# Patient Record
Sex: Female | Born: 1950 | Race: Black or African American | Hispanic: No | Marital: Single | Smoking: Former smoker
Health system: Southern US, Community
[De-identification: ages and names within clinical notes are randomized; demographics above are authoritative.]

## PROBLEM LIST (undated history)

## (undated) DIAGNOSIS — I10 Essential (primary) hypertension: Secondary | ICD-10-CM

## (undated) DIAGNOSIS — I639 Cerebral infarction, unspecified: Secondary | ICD-10-CM

## (undated) DIAGNOSIS — E079 Disorder of thyroid, unspecified: Secondary | ICD-10-CM

## (undated) DIAGNOSIS — C73 Malignant neoplasm of thyroid gland: Secondary | ICD-10-CM

## (undated) DIAGNOSIS — E785 Hyperlipidemia, unspecified: Secondary | ICD-10-CM

## (undated) DIAGNOSIS — M1990 Unspecified osteoarthritis, unspecified site: Secondary | ICD-10-CM

## (undated) HISTORY — DX: Hyperlipidemia, unspecified: E78.5

## (undated) HISTORY — DX: Essential (primary) hypertension: I10

## (undated) HISTORY — PX: TOTAL ABDOMINAL HYSTERECTOMY: SHX209

## (undated) HISTORY — DX: Disorder of thyroid, unspecified: E07.9

## (undated) HISTORY — PX: CARPAL TUNNEL RELEASE: SHX101

## (undated) HISTORY — PX: THYROIDECTOMY: SHX17

## (undated) HISTORY — DX: Cerebral infarction, unspecified: I63.9

---

## 1997-09-28 ENCOUNTER — Emergency Department (HOSPITAL_COMMUNITY): Admission: EM | Admit: 1997-09-28 | Discharge: 1997-09-28 | Payer: Self-pay | Admitting: Emergency Medicine

## 1998-03-06 ENCOUNTER — Observation Stay (HOSPITAL_COMMUNITY): Admission: EM | Admit: 1998-03-06 | Discharge: 1998-03-07 | Payer: Self-pay | Admitting: Emergency Medicine

## 1998-04-14 ENCOUNTER — Encounter: Payer: Self-pay | Admitting: *Deleted

## 1998-04-16 ENCOUNTER — Inpatient Hospital Stay (HOSPITAL_COMMUNITY): Admission: RE | Admit: 1998-04-16 | Discharge: 1998-04-17 | Payer: Self-pay | Admitting: *Deleted

## 1998-07-08 ENCOUNTER — Emergency Department (HOSPITAL_COMMUNITY): Admission: EM | Admit: 1998-07-08 | Discharge: 1998-07-08 | Payer: Self-pay | Admitting: Emergency Medicine

## 1998-08-22 ENCOUNTER — Emergency Department (HOSPITAL_COMMUNITY): Admission: EM | Admit: 1998-08-22 | Discharge: 1998-08-22 | Payer: Self-pay | Admitting: Emergency Medicine

## 1998-08-22 ENCOUNTER — Encounter: Payer: Self-pay | Admitting: Emergency Medicine

## 1999-01-26 ENCOUNTER — Encounter: Payer: Self-pay | Admitting: Emergency Medicine

## 1999-01-26 ENCOUNTER — Emergency Department (HOSPITAL_COMMUNITY): Admission: EM | Admit: 1999-01-26 | Discharge: 1999-01-26 | Payer: Self-pay | Admitting: Emergency Medicine

## 1999-02-22 ENCOUNTER — Encounter: Payer: Self-pay | Admitting: Emergency Medicine

## 1999-02-22 ENCOUNTER — Emergency Department (HOSPITAL_COMMUNITY): Admission: EM | Admit: 1999-02-22 | Discharge: 1999-02-22 | Payer: Self-pay

## 1999-06-30 ENCOUNTER — Emergency Department (HOSPITAL_COMMUNITY): Admission: EM | Admit: 1999-06-30 | Discharge: 1999-06-30 | Payer: Self-pay | Admitting: Emergency Medicine

## 1999-07-09 ENCOUNTER — Emergency Department (HOSPITAL_COMMUNITY): Admission: EM | Admit: 1999-07-09 | Discharge: 1999-07-09 | Payer: Self-pay | Admitting: Emergency Medicine

## 1999-08-23 ENCOUNTER — Ambulatory Visit (HOSPITAL_COMMUNITY): Admission: RE | Admit: 1999-08-23 | Discharge: 1999-08-23 | Payer: Self-pay | Admitting: Internal Medicine

## 1999-08-31 ENCOUNTER — Emergency Department (HOSPITAL_COMMUNITY): Admission: EM | Admit: 1999-08-31 | Discharge: 1999-08-31 | Payer: Self-pay | Admitting: Emergency Medicine

## 1999-09-07 ENCOUNTER — Encounter: Admission: RE | Admit: 1999-09-07 | Discharge: 1999-09-07 | Payer: Self-pay | Admitting: Internal Medicine

## 1999-10-12 ENCOUNTER — Other Ambulatory Visit: Admission: RE | Admit: 1999-10-12 | Discharge: 1999-10-12 | Payer: Self-pay | Admitting: Endocrinology

## 1999-11-21 ENCOUNTER — Emergency Department (HOSPITAL_COMMUNITY): Admission: EM | Admit: 1999-11-21 | Discharge: 1999-11-21 | Payer: Self-pay | Admitting: Emergency Medicine

## 2000-01-09 ENCOUNTER — Ambulatory Visit (HOSPITAL_COMMUNITY): Admission: RE | Admit: 2000-01-09 | Discharge: 2000-01-10 | Payer: Self-pay | Admitting: Internal Medicine

## 2000-03-24 ENCOUNTER — Emergency Department (HOSPITAL_COMMUNITY): Admission: EM | Admit: 2000-03-24 | Discharge: 2000-03-25 | Payer: Self-pay | Admitting: Emergency Medicine

## 2000-03-25 ENCOUNTER — Encounter: Payer: Self-pay | Admitting: Emergency Medicine

## 2001-09-29 ENCOUNTER — Emergency Department (HOSPITAL_COMMUNITY): Admission: EM | Admit: 2001-09-29 | Discharge: 2001-09-29 | Payer: Self-pay | Admitting: Emergency Medicine

## 2002-01-13 ENCOUNTER — Encounter: Payer: Self-pay | Admitting: Internal Medicine

## 2002-01-13 ENCOUNTER — Encounter: Admission: RE | Admit: 2002-01-13 | Discharge: 2002-01-13 | Payer: Self-pay | Admitting: Internal Medicine

## 2002-07-11 ENCOUNTER — Encounter: Payer: Self-pay | Admitting: Internal Medicine

## 2002-07-11 ENCOUNTER — Encounter: Admission: RE | Admit: 2002-07-11 | Discharge: 2002-07-11 | Payer: Self-pay | Admitting: Internal Medicine

## 2002-08-08 ENCOUNTER — Emergency Department (HOSPITAL_COMMUNITY): Admission: EM | Admit: 2002-08-08 | Discharge: 2002-08-08 | Payer: Self-pay | Admitting: Emergency Medicine

## 2003-07-25 ENCOUNTER — Emergency Department (HOSPITAL_COMMUNITY): Admission: EM | Admit: 2003-07-25 | Discharge: 2003-07-25 | Payer: Self-pay | Admitting: *Deleted

## 2004-07-13 ENCOUNTER — Encounter: Admission: RE | Admit: 2004-07-13 | Discharge: 2004-07-13 | Payer: Self-pay | Admitting: Internal Medicine

## 2005-06-02 ENCOUNTER — Inpatient Hospital Stay (HOSPITAL_COMMUNITY): Admission: EM | Admit: 2005-06-02 | Discharge: 2005-06-05 | Payer: Self-pay | Admitting: Emergency Medicine

## 2005-06-30 ENCOUNTER — Encounter (HOSPITAL_COMMUNITY): Admission: RE | Admit: 2005-06-30 | Discharge: 2005-09-28 | Payer: Self-pay | Admitting: Cardiology

## 2005-07-14 ENCOUNTER — Encounter: Admission: RE | Admit: 2005-07-14 | Discharge: 2005-07-14 | Payer: Self-pay | Admitting: Internal Medicine

## 2005-07-26 ENCOUNTER — Encounter: Payer: Self-pay | Admitting: Emergency Medicine

## 2007-03-28 DIAGNOSIS — I639 Cerebral infarction, unspecified: Secondary | ICD-10-CM

## 2007-03-28 HISTORY — DX: Cerebral infarction, unspecified: I63.9

## 2007-10-23 ENCOUNTER — Inpatient Hospital Stay (HOSPITAL_COMMUNITY): Admission: EM | Admit: 2007-10-23 | Discharge: 2007-10-30 | Payer: Self-pay | Admitting: Emergency Medicine

## 2007-10-24 ENCOUNTER — Encounter (INDEPENDENT_AMBULATORY_CARE_PROVIDER_SITE_OTHER): Payer: Self-pay | Admitting: Cardiology

## 2007-10-28 ENCOUNTER — Ambulatory Visit: Payer: Self-pay | Admitting: Physical Medicine & Rehabilitation

## 2007-10-30 ENCOUNTER — Inpatient Hospital Stay (HOSPITAL_COMMUNITY)
Admission: RE | Admit: 2007-10-30 | Discharge: 2007-11-02 | Payer: Self-pay | Admitting: Physical Medicine & Rehabilitation

## 2007-10-30 ENCOUNTER — Ambulatory Visit: Payer: Self-pay | Admitting: Physical Medicine & Rehabilitation

## 2007-11-05 ENCOUNTER — Encounter
Admission: RE | Admit: 2007-11-05 | Discharge: 2008-02-03 | Payer: Self-pay | Admitting: Physical Medicine & Rehabilitation

## 2007-12-03 ENCOUNTER — Encounter
Admission: RE | Admit: 2007-12-03 | Discharge: 2008-01-24 | Payer: Self-pay | Admitting: Physical Medicine & Rehabilitation

## 2007-12-04 ENCOUNTER — Ambulatory Visit: Payer: Self-pay | Admitting: Physical Medicine & Rehabilitation

## 2008-01-24 ENCOUNTER — Ambulatory Visit: Payer: Self-pay | Admitting: Physical Medicine & Rehabilitation

## 2008-02-04 ENCOUNTER — Encounter
Admission: RE | Admit: 2008-02-04 | Discharge: 2008-03-06 | Payer: Self-pay | Admitting: Physical Medicine & Rehabilitation

## 2008-04-23 ENCOUNTER — Encounter
Admission: RE | Admit: 2008-04-23 | Discharge: 2008-04-24 | Payer: Self-pay | Admitting: Physical Medicine & Rehabilitation

## 2008-04-24 ENCOUNTER — Ambulatory Visit: Payer: Self-pay | Admitting: Physical Medicine & Rehabilitation

## 2008-08-19 ENCOUNTER — Encounter
Admission: RE | Admit: 2008-08-19 | Discharge: 2008-08-21 | Payer: Self-pay | Admitting: Physical Medicine & Rehabilitation

## 2008-08-21 ENCOUNTER — Ambulatory Visit: Payer: Self-pay | Admitting: Physical Medicine & Rehabilitation

## 2008-11-09 ENCOUNTER — Encounter
Admission: RE | Admit: 2008-11-09 | Discharge: 2008-11-13 | Payer: Self-pay | Admitting: Physical Medicine & Rehabilitation

## 2008-11-13 ENCOUNTER — Ambulatory Visit: Payer: Self-pay | Admitting: Physical Medicine & Rehabilitation

## 2009-05-11 ENCOUNTER — Encounter
Admission: RE | Admit: 2009-05-11 | Discharge: 2009-05-14 | Payer: Self-pay | Admitting: Physical Medicine & Rehabilitation

## 2009-05-12 ENCOUNTER — Encounter: Admission: RE | Admit: 2009-05-12 | Discharge: 2009-05-12 | Payer: Self-pay | Admitting: Otolaryngology

## 2009-05-14 ENCOUNTER — Encounter: Admission: RE | Admit: 2009-05-14 | Discharge: 2009-05-14 | Payer: Self-pay | Admitting: Obstetrics

## 2009-05-14 ENCOUNTER — Ambulatory Visit: Payer: Self-pay | Admitting: Physical Medicine & Rehabilitation

## 2009-06-24 ENCOUNTER — Encounter: Admission: RE | Admit: 2009-06-24 | Discharge: 2009-06-24 | Payer: Self-pay | Admitting: Otolaryngology

## 2009-06-24 ENCOUNTER — Other Ambulatory Visit: Admission: RE | Admit: 2009-06-24 | Discharge: 2009-06-24 | Payer: Self-pay | Admitting: Interventional Radiology

## 2009-07-22 ENCOUNTER — Inpatient Hospital Stay (HOSPITAL_COMMUNITY): Admission: RE | Admit: 2009-07-22 | Discharge: 2009-07-24 | Payer: Self-pay | Admitting: Otolaryngology

## 2009-07-22 ENCOUNTER — Encounter (INDEPENDENT_AMBULATORY_CARE_PROVIDER_SITE_OTHER): Payer: Self-pay | Admitting: Otolaryngology

## 2009-11-07 ENCOUNTER — Inpatient Hospital Stay (HOSPITAL_COMMUNITY): Admission: EM | Admit: 2009-11-07 | Discharge: 2009-11-08 | Payer: Self-pay | Admitting: Emergency Medicine

## 2009-11-15 ENCOUNTER — Encounter (HOSPITAL_COMMUNITY): Admission: RE | Admit: 2009-11-15 | Discharge: 2009-12-14 | Payer: Self-pay | Admitting: Internal Medicine

## 2010-04-13 ENCOUNTER — Encounter
Admission: RE | Admit: 2010-04-13 | Discharge: 2010-04-13 | Payer: Self-pay | Source: Home / Self Care | Attending: Internal Medicine | Admitting: Internal Medicine

## 2010-04-16 ENCOUNTER — Other Ambulatory Visit: Payer: Self-pay | Admitting: Cardiology

## 2010-04-16 DIAGNOSIS — Z1239 Encounter for other screening for malignant neoplasm of breast: Secondary | ICD-10-CM

## 2010-04-29 ENCOUNTER — Other Ambulatory Visit (HOSPITAL_COMMUNITY): Payer: Self-pay | Admitting: Internal Medicine

## 2010-04-29 DIAGNOSIS — C73 Malignant neoplasm of thyroid gland: Secondary | ICD-10-CM

## 2010-05-09 ENCOUNTER — Ambulatory Visit (HOSPITAL_COMMUNITY)
Admission: RE | Admit: 2010-05-09 | Discharge: 2010-05-09 | Disposition: A | Discharge status: Home / Self Care | Payer: MEDICARE | Source: Ambulatory Visit | Attending: Internal Medicine | Admitting: Internal Medicine

## 2010-05-09 ENCOUNTER — Other Ambulatory Visit: Payer: Self-pay | Admitting: Interventional Radiology

## 2010-05-09 ENCOUNTER — Other Ambulatory Visit (HOSPITAL_COMMUNITY): Payer: Self-pay

## 2010-05-09 DIAGNOSIS — C73 Malignant neoplasm of thyroid gland: Secondary | ICD-10-CM

## 2010-05-09 DIAGNOSIS — R599 Enlarged lymph nodes, unspecified: Secondary | ICD-10-CM | POA: Insufficient documentation

## 2010-05-16 ENCOUNTER — Ambulatory Visit
Admission: RE | Admit: 2010-05-16 | Discharge: 2010-05-16 | Disposition: A | Discharge status: Home / Self Care | Payer: MEDICARE | Source: Ambulatory Visit | Attending: Cardiology | Admitting: Cardiology

## 2010-05-16 DIAGNOSIS — Z1239 Encounter for other screening for malignant neoplasm of breast: Secondary | ICD-10-CM

## 2010-05-19 ENCOUNTER — Other Ambulatory Visit: Payer: Self-pay | Admitting: Internal Medicine

## 2010-05-19 DIAGNOSIS — C73 Malignant neoplasm of thyroid gland: Secondary | ICD-10-CM

## 2010-05-23 ENCOUNTER — Other Ambulatory Visit: Payer: MEDICARE

## 2010-05-31 ENCOUNTER — Ambulatory Visit
Admission: RE | Admit: 2010-05-31 | Discharge: 2010-05-31 | Disposition: A | Discharge status: Home / Self Care | Payer: MEDICARE | Source: Ambulatory Visit | Attending: Internal Medicine | Admitting: Internal Medicine

## 2010-05-31 DIAGNOSIS — C73 Malignant neoplasm of thyroid gland: Secondary | ICD-10-CM

## 2010-05-31 MED ORDER — IOHEXOL 300 MG/ML  SOLN
75.0000 mL | Freq: Once | INTRAMUSCULAR | Status: AC | PRN
  Administered 2010-05-31: 75 mL via INTRAVENOUS

## 2010-06-01 ENCOUNTER — Emergency Department (HOSPITAL_COMMUNITY)
Admission: EM | Admit: 2010-06-01 | Discharge: 2010-06-01 | Disposition: A | Discharge status: Home / Self Care | Payer: MEDICARE | Attending: Emergency Medicine | Admitting: Emergency Medicine

## 2010-06-01 DIAGNOSIS — C73 Malignant neoplasm of thyroid gland: Secondary | ICD-10-CM | POA: Insufficient documentation

## 2010-06-01 DIAGNOSIS — R109 Unspecified abdominal pain: Secondary | ICD-10-CM | POA: Insufficient documentation

## 2010-06-01 DIAGNOSIS — M25559 Pain in unspecified hip: Secondary | ICD-10-CM | POA: Insufficient documentation

## 2010-06-01 DIAGNOSIS — I10 Essential (primary) hypertension: Secondary | ICD-10-CM | POA: Insufficient documentation

## 2010-06-01 DIAGNOSIS — R269 Unspecified abnormalities of gait and mobility: Secondary | ICD-10-CM | POA: Insufficient documentation

## 2010-06-01 DIAGNOSIS — E039 Hypothyroidism, unspecified: Secondary | ICD-10-CM | POA: Insufficient documentation

## 2010-06-01 DIAGNOSIS — E119 Type 2 diabetes mellitus without complications: Secondary | ICD-10-CM | POA: Insufficient documentation

## 2010-06-01 LAB — URINALYSIS, ROUTINE W REFLEX MICROSCOPIC
Glucose, UA: NEGATIVE mg/dL
Hgb urine dipstick: NEGATIVE
Specific Gravity, Urine: 1.006 (ref 1.005–1.030)
pH: 6 (ref 5.0–8.0)

## 2010-06-01 LAB — POCT I-STAT, CHEM 8
BUN: 17 mg/dL (ref 6–23)
Chloride: 103 mEq/L (ref 96–112)
Creatinine, Ser: 1 mg/dL (ref 0.4–1.2)
Potassium: 3.9 mEq/L (ref 3.5–5.1)
Sodium: 138 mEq/L (ref 135–145)

## 2010-06-01 LAB — URINE MICROSCOPIC-ADD ON

## 2010-06-09 LAB — HEMOGLOBIN A1C: Hgb A1c MFr Bld: 6.5 % — ABNORMAL HIGH (ref <5.7)

## 2010-06-09 LAB — URINALYSIS, MICROSCOPIC ONLY
Glucose, UA: NEGATIVE mg/dL
Hgb urine dipstick: NEGATIVE
Protein, ur: NEGATIVE mg/dL
pH: 6 (ref 5.0–8.0)

## 2010-06-09 LAB — BASIC METABOLIC PANEL
BUN: 15 mg/dL (ref 6–23)
Creatinine, Ser: 0.85 mg/dL (ref 0.4–1.2)
GFR calc non Af Amer: >60 mL/min (ref >60)
Glucose, Bld: 90 mg/dL (ref 70–99)

## 2010-06-09 LAB — SODIUM, URINE, RANDOM: Sodium, Ur: 23 mEq/L

## 2010-06-09 LAB — GLUCOSE, CAPILLARY: Glucose-Capillary: 150 mg/dL — ABNORMAL HIGH (ref 70–99)

## 2010-06-10 LAB — URINE CULTURE: Culture  Setup Time: 201108142356

## 2010-06-10 LAB — BASIC METABOLIC PANEL
BUN: 16 mg/dL (ref 6–23)
CO2: 25 mEq/L (ref 19–32)
Chloride: 83 mEq/L — ABNORMAL LOW (ref 96–112)
Creatinine, Ser: 0.89 mg/dL (ref 0.4–1.2)
Glucose, Bld: 128 mg/dL — ABNORMAL HIGH (ref 70–99)
Potassium: 3.8 mEq/L (ref 3.5–5.1)

## 2010-06-10 LAB — POCT CARDIAC MARKERS: Troponin i, poc: <0.05 ng/mL (ref 0.00–0.09)

## 2010-06-10 LAB — DIFFERENTIAL
Basophils Absolute: 0 10*3/uL (ref 0.0–0.1)
Eosinophils Absolute: 0.1 10*3/uL (ref 0.0–0.7)
Eosinophils Relative: 2 % (ref 0–5)
Lymphocytes Relative: 30 % (ref 12–46)
Monocytes Absolute: 0.6 10*3/uL (ref 0.1–1.0)

## 2010-06-10 LAB — CBC
HCT: 34 % — ABNORMAL LOW (ref 36.0–46.0)
MCH: 27.1 pg (ref 26.0–34.0)
MCV: 76.2 fL — ABNORMAL LOW (ref 78.0–100.0)
Platelets: 272 10*3/uL (ref 150–400)
RDW: 12.7 % (ref 11.5–15.5)

## 2010-06-10 LAB — URINALYSIS, ROUTINE W REFLEX MICROSCOPIC
Bilirubin Urine: NEGATIVE
Glucose, UA: NEGATIVE mg/dL
Hgb urine dipstick: NEGATIVE
Specific Gravity, Urine: 1.006 (ref 1.005–1.030)
Urobilinogen, UA: 0.2 mg/dL (ref 0.0–1.0)
pH: 6 (ref 5.0–8.0)

## 2010-06-10 LAB — GLUCOSE, CAPILLARY

## 2010-06-10 LAB — URINE MICROSCOPIC-ADD ON

## 2010-06-14 LAB — HEMOGLOBIN A1C
Hgb A1c MFr Bld: 6 % — ABNORMAL HIGH (ref <5.7)
Mean Plasma Glucose: 126 mg/dL — ABNORMAL HIGH (ref <117)

## 2010-06-14 LAB — GLUCOSE, CAPILLARY
Glucose-Capillary: 127 mg/dL — ABNORMAL HIGH (ref 70–99)
Glucose-Capillary: 137 mg/dL — ABNORMAL HIGH (ref 70–99)
Glucose-Capillary: 143 mg/dL — ABNORMAL HIGH (ref 70–99)
Glucose-Capillary: 161 mg/dL — ABNORMAL HIGH (ref 70–99)
Glucose-Capillary: 198 mg/dL — ABNORMAL HIGH (ref 70–99)
Glucose-Capillary: 224 mg/dL — ABNORMAL HIGH (ref 70–99)
Glucose-Capillary: 95 mg/dL (ref 70–99)

## 2010-06-14 LAB — COMPREHENSIVE METABOLIC PANEL
Alkaline Phosphatase: 65 U/L (ref 39–117)
BUN: 21 mg/dL (ref 6–23)
Chloride: 103 mEq/L (ref 96–112)
Creatinine, Ser: 0.9 mg/dL (ref 0.4–1.2)
GFR calc non Af Amer: >60 mL/min (ref >60)
Glucose, Bld: 96 mg/dL (ref 70–99)
Potassium: 4.4 mEq/L (ref 3.5–5.1)
Total Bilirubin: 0.4 mg/dL (ref 0.3–1.2)

## 2010-06-14 LAB — CBC
HCT: 35.5 % — ABNORMAL LOW (ref 36.0–46.0)
Hemoglobin: 12.4 g/dL (ref 12.0–15.0)
MCV: 84.1 fL (ref 78.0–100.0)
Platelets: 322 10*3/uL (ref 150–400)
RDW: 14.4 % (ref 11.5–15.5)
WBC: 6.6 10*3/uL (ref 4.0–10.5)

## 2010-06-14 LAB — MRSA PCR SCREENING: MRSA by PCR: NEGATIVE

## 2010-06-27 ENCOUNTER — Encounter (HOSPITAL_COMMUNITY)
Admission: RE | Admit: 2010-06-27 | Discharge: 2010-06-27 | Disposition: A | Discharge status: Home / Self Care | Payer: PRIVATE HEALTH INSURANCE | Source: Ambulatory Visit | Attending: Otolaryngology | Admitting: Otolaryngology

## 2010-06-27 DIAGNOSIS — Z01812 Encounter for preprocedural laboratory examination: Secondary | ICD-10-CM | POA: Insufficient documentation

## 2010-06-27 LAB — HEPATIC FUNCTION PANEL
ALT: 12 U/L (ref 0–35)
AST: 16 U/L (ref 0–37)
Albumin: 3.8 g/dL (ref 3.5–5.2)
Alkaline Phosphatase: 60 U/L (ref 39–117)
Bilirubin, Direct: <0.1 mg/dL (ref 0.0–0.3)
Total Bilirubin: 0.2 mg/dL — ABNORMAL LOW (ref 0.3–1.2)

## 2010-06-27 LAB — CBC
HCT: 35.2 % — ABNORMAL LOW (ref 36.0–46.0)
MCHC: 33.8 g/dL (ref 30.0–36.0)
MCV: 80.5 fL (ref 78.0–100.0)
Platelets: 247 10*3/uL (ref 150–400)
RDW: 13.6 % (ref 11.5–15.5)
WBC: 5.3 10*3/uL (ref 4.0–10.5)

## 2010-06-27 LAB — BASIC METABOLIC PANEL
BUN: 13 mg/dL (ref 6–23)
CO2: 27 mEq/L (ref 19–32)
Chloride: 102 mEq/L (ref 96–112)
Creatinine, Ser: 0.88 mg/dL (ref 0.4–1.2)
Glucose, Bld: 121 mg/dL — ABNORMAL HIGH (ref 70–99)
Potassium: 4.2 mEq/L (ref 3.5–5.1)

## 2010-06-27 LAB — APTT: aPTT: 26 seconds (ref 24–37)

## 2010-07-04 ENCOUNTER — Inpatient Hospital Stay (HOSPITAL_COMMUNITY)
Admission: RE | Admit: 2010-07-04 | Discharge: 2010-07-07 | DRG: 828 | Disposition: A | Discharge status: Home / Self Care | Payer: PRIVATE HEALTH INSURANCE | Source: Ambulatory Visit | Attending: Otolaryngology | Admitting: Otolaryngology

## 2010-07-04 ENCOUNTER — Other Ambulatory Visit: Payer: Self-pay | Admitting: Otolaryngology

## 2010-07-04 DIAGNOSIS — C779 Secondary and unspecified malignant neoplasm of lymph node, unspecified: Principal | ICD-10-CM | POA: Diagnosis present

## 2010-07-04 DIAGNOSIS — Z85858 Personal history of malignant neoplasm of other endocrine glands: Secondary | ICD-10-CM

## 2010-07-04 DIAGNOSIS — C50919 Malignant neoplasm of unspecified site of unspecified female breast: Principal | ICD-10-CM | POA: Diagnosis present

## 2010-07-04 LAB — HEMOGLOBIN A1C
Hgb A1c MFr Bld: 7 % — ABNORMAL HIGH (ref <5.7)
Mean Plasma Glucose: 154 mg/dL — ABNORMAL HIGH (ref <117)

## 2010-07-04 LAB — GLUCOSE, CAPILLARY: Glucose-Capillary: 118 mg/dL — ABNORMAL HIGH (ref 70–99)

## 2010-07-05 LAB — GLUCOSE, CAPILLARY
Glucose-Capillary: 142 mg/dL — ABNORMAL HIGH (ref 70–99)
Glucose-Capillary: 152 mg/dL — ABNORMAL HIGH (ref 70–99)

## 2010-07-06 LAB — COMPREHENSIVE METABOLIC PANEL
ALT: 12 U/L (ref 0–35)
Albumin: 3 g/dL — ABNORMAL LOW (ref 3.5–5.2)
Alkaline Phosphatase: 42 U/L (ref 39–117)
Glucose, Bld: 141 mg/dL — ABNORMAL HIGH (ref 70–99)
Potassium: 3.7 mEq/L (ref 3.5–5.1)
Sodium: 139 mEq/L (ref 135–145)
Total Protein: 6.3 g/dL (ref 6.0–8.3)

## 2010-07-06 LAB — GLUCOSE, CAPILLARY: Glucose-Capillary: 112 mg/dL — ABNORMAL HIGH (ref 70–99)

## 2010-07-07 LAB — GLUCOSE, CAPILLARY
Glucose-Capillary: 156 mg/dL — ABNORMAL HIGH (ref 70–99)
Glucose-Capillary: 166 mg/dL — ABNORMAL HIGH (ref 70–99)

## 2010-07-15 ENCOUNTER — Other Ambulatory Visit (HOSPITAL_COMMUNITY): Payer: Self-pay | Admitting: Internal Medicine

## 2010-07-15 DIAGNOSIS — C73 Malignant neoplasm of thyroid gland: Secondary | ICD-10-CM

## 2010-07-22 NOTE — Op Note (Signed)
NAME:  [name] [name], [name] [name]              ACCOUNT NO.:  [id number]  MEDICAL RECORD NO.:  [id number]           PATIENT TYPE:  I  LOCATION:  5128                         FACILITY:  [facility]  PHYSICIAN:  Zola Button T. Lazarus Salines, M.D. DATE OF BIRTH:  1950/12/22  DATE OF PROCEDURE:  07/04/2010 DATE OF DISCHARGE:                              OPERATIVE REPORT   PREOPERATIVE DIAGNOSIS:  Metastatic thyroid carcinoma, left neck.  POSTOPERATIVE DIAGNOSIS:  Metastatic thyroid carcinoma, left neck.  PROCEDURE PERFORMED: 1. Left functional neck dissection. 2. Anterior compartment exploration.  SURGEON:  Gloris Manchester. Lazarus Salines, MD  ASSISTANT:  Pollyann Kennedy.  ANESTHESIA:  General orotracheal.  BLOOD LOSS:  100 mL.  COMPLICATIONS:  None.  FINDINGS:  Multiple 2-cm or less firm lozenge-shaped lymph nodes in the left neck including levels II, III, IV and V.  No palpable adenopathy in level I or in level VI.  PROCEDURE:  With the patient in a comfortable supine position, general orotracheal anesthesia was induced without difficulty.  At an appropriate level, a shoulder roll was placed and the neck was extended and the head rotated to the left for access to the right neck. Orienting initials were identified.  The patient was placed in a mild reverse Trendelenburg.  A sterile preparation and draping of the entire neck was performed in the standard fashion.  Before preparation, the nerve integrity monitor was set up for use with the nerve integrity monitor endotracheal tube.  The neck was palpated with no significant findings.  The previous thyroidectomy scar was felt to be too low to adequately perform a high neck dissection.  Therefore, a wrinkle was identified approximately halfway up the neck and was marked for the incision.  This was curved up just slightly towards the mastoid tip.  The wound was executed using a cutting cautery and carried down through skin and subcutaneous fat.  The platysma muscle was  identified and lysed.  Subplatysmal planes were raised superiorly and inferiorly.  The ramus mandibular nerve was identified and protected after minor dissection.  The inferior flaps were raised likewise down to the clavicle and sternal notch.  Surgical changes were identified in the midline.  Flaps were retained up and down with 2-0 silk retention sutures.  The fascia on the lateral surface of the sternocleidomastoid muscle was elevated including lysis of the external jugular vein and controlled with silk suture.  The fascia was laid forward and wrap around the anterior edge of the sternocleidomastoid muscle and then dissected down the medial surface.  The spinal accessory nerve was identified and preserved.  Dissection was carried from the angle of the mandible to the mastoid tip and the tail of the parotid was carried downward. Dissection was carried down to the posterior belly of the digastric muscle.  Working through soft tissues, the spinal accessory nerve was dissected upward.  The posterior superior corner of the neck level IIB was dissected deep to the sternocleidomastoid muscle down to the surface of the levator scapulae and trapezius muscles.  It was dissected out of the corner carried down towards the mid neck and finally dissected underneath the spinal accessory nerve.  The  nerve was dissected further upward.  Working deep to the sternocleidomastoid muscle, the dissection was carried down into the posterior triangle along the trapezius muscle. The floor was identified and the tissue was swept forward.  The fatty tissues of the supraclavicular triangle were incised with the Bovie and rolled upward.  Branches of the transverse cervical vein were identified and controlled.  Cervical roots were tied long to avoid damage to the phrenic nerve.  Working anteriorly in the posterior triangle, lymph node bearing tissue was identified and also some vessels consistent with thoracic  duct.  These were controlled with a silk ligature and with Ligaclips.  Working upwards, the lateral surface of the jugular vein was identified. The dissection was carried behind jugular vein and the spinal accessory nerve was separated from the jugular vein.  Working in the posterior medial surface of the jugular vein, the fascia was incised and rolled around forward taking the posterior triangle tissue with it.  The vagus nerve was identified and protected.  The dissection was carried all the way around the jugular vein.  Various branches were controlled with 3-0 silk ligature.  Upon dissecting the specimen from the jugular vein, it was similarly dissected from its carotid artery.  The dissection was carried along the digastric muscle and the inferior submandibular triangle fascia.  The first level I was not dissected.  Hypoglossal nerve was identified lateral to the carotid artery and was protected. The ansa hypoglossi was ligated and divided.  Working forward from the carotid artery, dissection was carried onto the fascia of the strap muscles which were dissected forward across the digastric muscle down in the midline and finally the specimen was delivered.  The various nodal stations were identified with identifying sutures.  Hemostasis was observed.  The specimen was sent off for pathologic interpretation.  Careful palpation revealed no additional nodal tissue in the neck.  The head was rotated centered and the anterior inferior neck was dissected in the midline through some previous surgical changes.  The midline raphe of the strap muscles was divided in 2 layers.  There was no activation of the nerve monitor noted.  Upon opening the space in the midline, there was essentially no palpable tissue separating this from the trachea itself.  Palpation lateral to the trachea on both sides, there was no palpable adenopathy and minimal fatty tissue.  The access was quite difficult and  it was felt that there was likely to be more harm than good by continuing the dissection.  No specimen from level VI was delivered.  At this point, the neck dissection was completed.  The wound was rinsed. Small amounts of bleeding were controlled with cautery and with silk ligature.  The carotid artery was preserved intact.  The jugular vein was preserved intact and the spinal accessory nerve was also preserved. A 15-French fluted drain was placed through the posterior triangle and laid deep to the sternocleidomastoid muscle into the dissected field. This was secured to the skin with a 3-0 nylon stitch.  Upon observing hemostasis, previous cross-hatched marks were identified and the neck wound was closed beginning with 3-0 chromic in the platysma layer and finally skin staples.  The drain was observed to be functioning and hemostasis was observed.  Bacitracin ointment was applied externally. The drapes which have been stapled in place were freed and removed.  The neck flap was flat and intact.  The patient was returned to Anesthesia, awakened, extubated, and transferred to recovery in stable condition.  COMMENT:  A 60 year old black female almost 1 year status post total thyroidectomy for papillary carcinoma, now with rising thyroglobulin.  A CT scan showing left-sided lymphadenopathy which was suspicious, right- sided lymphadenopathy which was not suspicious and the needle-aspiration which was positive for papillary carcinoma were all the indications for today's procedure.  Anticipate routine postoperative recovery, continues to do ice, elevation, analgesia, and wound drainage.  She will be able to go home when the wound drainage has settled.  We will await pathologic interpretation of the specimen.  We will be assisted in her care with Dr. Talmage Coin and consideration for a second course of ablative radioactive iodine will be considered.     Gloris Manchester. Lazarus Salines,  M.D.     KTW/MEDQ  D:  07/04/2010  T:  07/05/2010  Job:  [id number]  cc:   Tonita Cong, M.D. Osvaldo Shipper. Spruill, M.D.  Electronically Signed by Flo Shanks M.D. on 07/22/2010 05:17:32 PM

## 2010-08-09 NOTE — H&P (Signed)
NAMELAINEY, NELSON NO.:  [id number]   MEDICAL RECORD NO.:  [id number]          PATIENT TYPE:  IPS   LOCATION:  4005                         FACILITY:  [facility]   PHYSICIAN:  Ranelle Oyster, M.D.DATE OF BIRTH:  1950-04-11   DATE OF ADMISSION:  10/30/2007  DATE OF DISCHARGE:                              HISTORY & PHYSICAL   CHIEF COMPLAINT:  Left-sided weakness.   HISTORY OF PRESENT ILLNESS:  This is a pleasant 60 year old African  American female with diabetes and hypertension who was admitted on October 24, 2007, with a lean to the left and instability in her gait.  Head CT  was without acute changes.  MRI of the brain ultimately revealed infarct  in the right posterior internal capsule.  MRA of the neck revealed an  atherosclerosis the left carotid bifurcation.  Carotid Dopplers showed  right-sided 60-80% ICA stenosis at bifurcation.  Neuro recommended  aspirin for stroke prophylaxis.  The patient has continued have some  problems with mobility and self care due to her weakness in the left  side and thus was admitted to Rehab today after a consultation by our  service.  Other issues of note include current diabetes control as well  as hypertension.   REVIEW OF SYSTEMS:  Notable for constipation.  Denies shortness of  breath or chest pain.  She is emptying her bladder intermittently.  SLEEPING:  She is sleeping well.  Mood has been good.  Full 14-point  review of systems is in the written H&P.   PAST MEDICAL HISTORY:  Positive for diabetes and hypertension.   Family history is positive for diabetes.  Also, hypertension.   SOCIAL HISTORY:  The patient lives alone.  Planning to live with  daughter upon discharge.  Son-in-law can also provide some supervision.  She is a former smoker.  She does not drink.  She works as a  Health and safety inspector at [employer].  Her home has four steps to enter and is has  one level.   The patient was completely independent prior to  arrival.  She requires  currently supervision to min assist for basic self care.  Mobility is  min assist due to decreased balance and knee control with gait and  transferring.   HOME MEDICATIONS:  Aspirin 325 mg daily, Lovenox 40 mg daily, Glucophage  500 mg b.i.d., niacin 500 mg nightly, Zocor 40 mg daily, Amaryl 4 mg  daily, Lantus insulin 20 units nightly, Benicar 20 mg daily, and Foltx  daily.   PHYSICAL EXAM:  VITAL SIGNS:  Pulse 72, temperature 90.3, blood pressure  127/78, and respiratory rate 18.  She is sating 100% on room air.  GENERAL:  The patient is pleasant, alert and oriented x3.  EYES:  Pupils equally round and reactive to light.  SKIN:  Intact without signs of breakdown.  ENT:  Ear, nose, and throat exam is unremarkable with fair dentition and  pink moist mucosa.  NECK:  Supple without JVD or lymphadenopathy.  CHEST:  Clear to auscultation bilaterally without wheezes, rales, or  rhonchi.  HEART:  Regular rate and rhythm without  murmur, rub, or gallops.  ABDOMEN:  Soft and nontender.  Bowel sounds are positive.  NEUROLOGIC:  Cranial nerves II through XII revealed minimal left-sided  facial asymmetry.  No sensory findings were noted in the face.  The  patient has full visual fields and is able to track left and right  without any deficits.  She had excellent visual acuity.  Intact cough.  Speech is intact with clear articulation.  Reflexes are 1+.  Sensation  was a bit decreased when I felt through the left arm and leg today.  Strength was generally 5/5 right upper extremity, 4+/5 left upper  extremity.  Right lower extremities 5/5, left lower extremity she has  2+/5 proximal to 4/5 distally.  Judgment, orientation, memory, and mood  were all within functional limits.   LABORATORY DATA:  Sodium 138, potassium 4.0, BUN 17, and creatinine is  0.77.  WBCs 5.9, hemoglobin 13.6, and platelets 238,000.  Homocystine  13.1.  Lipid profile:  Total cholesterol of 207 and LDL  elevated at 150.  Hemoglobin A1c 15.8 at admission.   ASSESSMENT AND PLAN:  1. Functional deficit secondary to right posterior internal capsule      stroke, left hemiparesis.  The patient is admitted to see      Interdisciplinary Collaborative Rehab between the physiatrist,      nursing staff, and therapy team.  The patient's complex medical and      functional needs cannot be provided at a lesser intensity of      service.  They require this interaction between the groups      mentioned above.  Physiatrist will provide 24-hour medical      oversight as well as oversight of therapy:  Plans and interaction      of the two.  A 24-hour rehab nurse will follow for bowel and      bladder needs, specifically constipation.  Assess for safety,      nutritional needs, and help integrate therapy concepts and goals.      PT will assess and treat for balance, gait, and equipment as needed      as well as family education.  OT will assess for safety, ADLs, and      appropriate equipment and family education.  Case management/social      worker will assess for psychosocial needs and discharge planning.      Rehab conferences will be held weekly to assess rehab progress and      set goals as well as assess for barriers to discharge.  Estimated      length of stay is 5-7 days.  Goals modified independent with PT and      OT.  Prognosis good.  2. Hypertension:  Blood pressure better controlled.  Current regimen      of Benicar 20 mg daily.  3. Diabetes:  The patient is currently on 20 units of Lantus insulin      at bedtime as well as Amaryl 4 mg daily, Glucophage 500 mg b.i.d.      Cover as appropriate with sliding scale insulin.  4. Stroke prophylaxis with aspirin 325 mg daily.  Also, continue      Foltx, Zocor, and niacin.  5. Deep vein thrombosis prophylaxis, subcu Lovenox daily.  Observe      platelets and for any signs of bleeding.  CBC will be checked in      the morning.       Ranelle Oyster, M.D.  Electronically Signed     ZTS/MEDQ  D:  10/30/2007  T:  10/31/2007  Job:  [id number]

## 2010-08-09 NOTE — Assessment & Plan Note (Signed)
[name] [name] is back regarding her right CVA.  She had a good results with  the left shoulder injection, but was still having some neuropathic pain  in the arm.  Her family doctor had increased her Neurontin which she is  now taking 300 mg t.i.d.  She complains of some pain at the lower biceps  region in the left arm with sometimes it is painful and will cause some  spasm and tenderness.  She says it feels a bit different from the pain  related to the stroke which is tingling and burning.  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  She would like to return to work.   REVIEW OF SYSTEMS:  Notable for occasional dizziness.  Other pertinent  positives are above and full review is in the written health and history  section of the chart.   SOCIAL HISTORY:  The patient is widowed and living alone.   PHYSICAL EXAMINATION:  VITAL SIGNS:  Blood pressure is 150/80, pulse is  99, respiratory rate 18.  She is sating 97% on room air.  GENERAL:  The patient is pleasant, alert, and oriented x3.  Affect is  generally bright and appropriate.  EXTREMITIES:  She walks with gait favoring the left side.  Sometimes,  the right knee even tends to hyperextend in stance phase.  She has a  decreased sensation in the left arm and leg as well as the face to a  lesser extent.  Sensation is 1/2.  She has reasonable strength at 4-5/5.  Fine motor coordination decreased in both arm and leg today.  She has  mild left central VII still.  No obvious visual-spatial deficits are  seen to the left today.  Left biceps seems to be a bit tender with  supination and with flexion at the elbow.  There is some mild tenderness  along the distal tendon.  No swelling, erythema, or temperature changes  are appreciated at the arm.  NEUROLOGIC:  Cognitively, the patient is  appropriate.  HEART:  Regular rate.  CHEST:  Clear.  ABDOMEN:  Soft, nontender.   ASSESSMENT:  1. 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  2. Type 2 diabetes.  3. Left rotator cuff syndrome.  4. History of carpal tunnel syndrome.  5. Hypertension.  6. Left biceps pain.   PLAN:  1. I think she has symptoms in her arm are more musculoskeletal in      nature.  I do not believe these are neuropathy related, although      they certainly may be skewed or exacerbated by her stroke and      related to neuropathic pain.  I recommended ice and Voltaren gel to      the left biceps area and tendons.  2. I would keep Neurontin the same at 300 mg t.i.d. for now.  3. I think, the patient can return to the sedentary work.  I think, it      would be good for her emotionally.  4. She will return to see me in 3 months' time.      Ranelle Oyster, M.D.  Electronically Signed     ZTS/MedQ  D:  08/21/2008 13:53:44  T:  08/22/2008 03:52:31  Job #:  [id number]   cc:   Osvaldo Shipper. Spruill, M.D.  Fax: [phone]

## 2010-08-09 NOTE — Assessment & Plan Note (Signed)
[name] [name] is back regarding her right CVA with left hemiparesis.  She is  doing fairly well with the most appropriate [facility]  She has some concerns because her work is expecting  her to go back and start full-time.  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  She has to follow and  run after children as well.  The patient states that her balance has  improved, but she still has some issues with shifting weight and being  on uneven surfaces.  She complains of numbness and tingling in her left  hand and the left hand frequently being cold.  She does have a history  of carpal tunnel syndrome and release.  She also complains of pain into  the left elbow.  Overall, sleep is good.  Mood is improved.  She has  generally been happy how she is progressing.   REVIEW OF SYSTEMS:  Notable for the above.  Full 14-point review is in  the written health and history section of the chart.   SOCIAL HISTORY:  The patient is single and has a supportive family.   PHYSICAL EXAMINATION:  VITAL SIGNS:  Blood pressure is 157/69, pulse is  94, and respiratory rate 18.  She is sating 99% on room air.  GENERAL:  The patient is pleasant, alert and oriented x3.  NEUROLOGIC:  Affect is bright and appropriate.  Gait is slightly wide  based, but improved in speed and balance.  She favors the left leg a bit  in gait, but has no buckling and stance today.  She has good strength of  5/5 in all 4 limbs.  Sensation remains 1+ to 2/2 in the left arm, leg,  and face.  The left fingers may be a bit more affected.  Tinel test is  negative at the wrist.  She has good intrinsic muscle strength in the  left and right hands today.  Biceps tendon was minimally tender at the  left elbow, nontender at the shoulder.  Cognitively, she was normal.  Cranial nerve exam is grossly intact.  HEART:   Regular.  CHEST:  Clear.  ABDOMEN:  Soft and nontender.   ASSESSMENT:  1. 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  2. Type 2 diabetes.  3. Hypertension.  4. Dyslipidemia.  5. History of carpal tunnel syndrome.   PLAN:  1. The patient is doing extremely well, but I do not see her being      able to go back to the type of job responsibilities listed above.      I would like to see her work on finishing up her PT and on home      exercise program.  She might benefit from work-conditioning program      if she wants to return to that type of work eventually.  2. I recommended left wrist splint and warming garments, i.e., gloves      to left hand in the cooler months coming up.  I do not think there      were any blood flow issues.  The stroke, diabetes, and her carpal      tunnel syndrome can      be playing a role onto this sensation she is experiencing.  3. I will see her back in about 3 months'  time.  Overall, she is      making fair progress.      Ranelle Oyster, M.D.  Electronically Signed     ZTS/MedQ  D:  01/24/2008 13:26:44  T:  01/25/2008 02:43:01  Job #:  [id number]   cc:   Osvaldo Shipper. Spruill, M.D.  Fax: [phone]

## 2010-08-09 NOTE — Consult Note (Signed)
NAME:  [name] [name], [name] [name]              ACCOUNT NO.:  [id number]   MEDICAL RECORD NO.:  [id number]          PATIENT TYPE:  INP   LOCATION:  1445                         FACILITY:  [facility]   PHYSICIAN:  Melvyn Novas, M.D.  DATE OF BIRTH:  1951/01/08   DATE OF CONSULTATION:  10/24/2007  DATE OF DISCHARGE:                                 CONSULTATION   REASON FOR CONSULTATION:  Right internal capsule infarction.   HISTORY OF PRESENT ILLNESS:  This is a pleasant 60 year old African  American female who on October 23, 2007, went to work.  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  She noticed at around 9 a.m. that she  started to walk differently.  The patient states that upon walking, she  noticed that she veered to the left and had difficulty keeping her  balance and felt as though her left leg was giving out from underneath  her.  At that time she had no numbness or tingling, no problems with  speech.  At 9:30 a.m. the patient noticed that she was getting dizzy and  felt swimmy in the head.  The patient got relieved from work and got  in her car and started driving towards the nearest EMS.   Upon getting to [facility], the patient got out of the car and  talked to some of the EMS Faculty, who did not notice anything abnormal  about the patient, but was worried that she might have suffered a  stroke.  The patient was brought to the [facility] Emergency  Room immediately, where a CT scan was taken which showed negative for  infarction.  The patient was admitted at that time.  The patient states  that later that evening while in the hospital, she noticed that her left  arm and left leg became heavy and numb.  Again, she had no problems with  speech, vision, her auditory or vestibular system.  The patient notes  that every time she would try to get out of bed, she was unable to  control her left leg.  At that time an MRI was taken of the patient's  head, which showed a small  internal capsule infarction on the right.  The patient states that at no time did she have any chest pain,  shortness of breath, decreased vision, problems speaking or mentation.  She felt as though her speech was fluid.  Her main complaint and signs  and symptoms were numbness and tingling in her left leg and left arm  with greater weakness in the left leg than the left arm.  The patient is  otherwise a healthy individual.   PAST MEDICAL HISTORY:  1. Hypertension.  2. Diabetes.  She states that she tries to keep good control of her      diabetes.   MEDICATIONS:  1. Avandia.  2. Lantus.   ALLERGIES:  No known drug allergies.   SOCIAL HISTORY:  The patient is employed and works as a Health and safety inspector.  She does not smoke.  Does not drink.  Does not do any illicit drugs.  She tries to drink plenty of water during the day.   REVIEW OF SYSTEMS:  GENERAL:  The patient denies any general weight  change, fever or weakness.  HEMATOLOGIC:  Denies any anemia, increased  bleeding or coagulopathy.  HEENT:  Denies any trauma, nausea, vomiting,  dizziness other than perceived within the last two days.  No  lightheadedness other than perceived in the last two days.  No diplopia,  decreased vision or tinnitus or hearing loss.  RESPIRATORY:  She denies  any cough, shortness of breath, asthma or bronchitis.  CARDIOVASCULAR:  She does have hypertension but denies palpitations, angina, dyspnea on  exertion or syncope.  GI:  She denies any diarrhea, constipation or  abdominal pain.  GENITOURINARY:  She denies any polyuria, dysuria,  nocturia, urgency, polydipsia or loss of bowel or bladder.  ENDOCRINE:  She does have diabetes.  Denies any increase or decrease in hunger.  VASCULAR:  Denies any claudication, PAD or leg edema.  MUSCULOSKELETAL:  Denies any joint pains, stiffness or arthritis.  NEUROLOGIC:  Other than  recently, she denies any numbness, tingling, blackouts or tremors.  PSYCHIATRIC:  She  denies any anxiety, depression or decrease in memory.   PHYSICAL EXAMINATION:  GENERAL:  This is a pleasant 60 year old [place]  American female.  VITAL SIGNS:  Blood pressure 143/88, pulse 95, respirations 18,  temperature 98.3 degrees.  MENTAL STATUS/NEUROLOGIC:  She is alert and oriented.  She is able to  carry out two and three-step commands.  She is able to recall three  objects.  Her mentation is within normal limits.  She has good  articulation and good sentence structure.  Cranial nerves:  Pupils  equal, reactive to light and accommodation.  Conjugate gaze.  Extraocular muscles intact.  Visual fields intact.  She has horizontal  nystagmus at the end of her gaze on the right and left.  Face is  asymmetrical with a slight facial droop on the left.  Tongue midline.  Uvula midline.  Sensation decreased over the V3 portion of her facial  nerve.  Shoulder shrug is decreased on the left side and her head turn  strength is decreased on the left.  Coordination:  Finger-to-nose normal  on the right, decreased on the left secondary to strength.  Heel-to-shin  normal on the right, decreased on the left, secondary to strength.  Fine  motor movement:  She has decreased fine motor movement on the left hand  and she has positive orbiting, right over left.  Her gait was unable to  be visualized, secondary to the patient being severely unsteady due to  weakness of her left leg.  It took two people to hold her steady in a  standing position.  Motor:  Left head turn, shoulder shrug, biceps  flexion, triceps extension, wrist flexion and extension were all 4/5.  Left hip extension, flexion 3/5.  Left knee extension is 4/5.  Left knee  flexion is 3/5.  Left dorsiflexion 3/5.  Left plantar flexion  4/5.  Right thigh is 5/5 globally.  She has a positive drift in her left arm  and left leg.  Negative for her right arm and right leg.  Reflexes:  She  has downgoing toes on her right and upgoing toes on her  left.  She has  0/4 deep tendon reflexes in her Achilles, 1/4 deep tendon patellar  reflex on her left.  All else is 2/4.  LUNGS:  Clear to auscultation bilaterally.  CARDIOVASCULAR:  S1 and  S2 audible.  A regular rate and rhythm.  NECK:  Negative for bruit, supple.  HEENT:  As stated, sensation decreased over facial nerve V2 and V3.  EXTREMITIES:  Decreased over left arm and left leg to pinprick and light  touch.  Intact globally to vibration.   LABORATORY DATA:  HPA1 is 15.8.  Cholesterol 266, HDL 42, LDL 204.  Urinalysis negative.  Sodium 131, potassium 4, chloride 98, bicarb 24,  BUN 10, creatinine 0.72.  On admission her blood glucose was 528.  No  current blood glucose at this time.  Hemoglobin 14.5, hematocrit 43.2,  white blood cells 7.4, platelets 315.   IMAGING:  MRI of the head showed a small area of acute infarction in the  posterior limb of the right internal capsule.  CT was negative.   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  The patient is now outside the three-hour window for TPA, so will be  kept n.p.o. until swallowing evaluation.  At this time we will order  transcarotid Dopplers, MRA of the head, transcranial Doppler,  homocysteine level and fasting lipid profile.  Daily aspirin 81 mg.  PT  for gait and OT for activities of daily living.   Will continue to follow the patient while in-house.  Dr. Janalyn Shy P. Sethi  and Dr. Porfirio Mylar Dohmeier will follow up with my examination.     ______________________________  Felicie Morn, PA-C      Melvyn Novas, M.D.  Electronically Signed    DS/MEDQ  D:  10/24/2007  T:  04/30/2008  Job:  [id number]

## 2010-08-09 NOTE — Discharge Summary (Signed)
NAME:  [name] [name], [name] [name]              ACCOUNT NO.:  [id number]   MEDICAL RECORD NO.:  [id number]          PATIENT TYPE:  INP   LOCATION:  1444                         FACILITY:  [facility]   PHYSICIAN:  Osvaldo Shipper. Spruill, M.D.DATE OF BIRTH:  March 04, 1951   DATE OF ADMISSION:  10/23/2007  DATE OF DISCHARGE:  10/30/2007                               DISCHARGE SUMMARY   ADMISSION/PROVISIONAL DIAGNOSES:  1. Possible stroke.  2. Hypertension.  3. Diabetes.  4. Obesity.  5. Osteoarthritis   DISCHARGE DIAGNOSES:  1. Internal capsule stroke right brain.  2. Uncontrolled hypertension.  3. Uncontrolled diabetes.  4. Obesity.  5. Osteoarthritis.   BRIEF HISTORY AND REASON FOR ADMISSION:  This 60 year old white woman  presented to the hospital emergency room complaining of weakness and  difficulty walking.  She stated that this onset was rather sudden and  occurred while she was at work.  She walked through the fire accident,  had blood pressure taken, which was elevated.  She thought that her  sugar was too low and ate crackers, candy, and a soda, but did not  improve her symptomatology.  The patient denied any syncope.  No  palpitations.   She denied any vomiting, headache, blurred vision, chest pain, or nausea  but when she came to emergency room she was evaluated and found to have  an abnormal gait.  The patient had a CAT scan performed in the emergency  room, which was unrevealing, but subsequently had a MRI,  which did  reveal a stroke.  The patient's admitted to the hospital for acute  treatment of cerebrovascular accident.  The patient's radiologic reports  revealed the following.  The MRI of the patient's brain revealed  evidence of left carotid bifurcation atherosclerosis, hemodynamically  significant stenosis identified in the neck and the MRI of the brain  revealed evidence of right internal capsule stroke.  The patient's  laboratory studies revealed the following.  Urinalysis  performed on October 23, 2007, revealed glucose greater than 1000 mg/dL, specific gravity  0.454 amount of leukocytes, nitrate was negative.  BMET performed on the  same day revealed serum sodium of 131, potassium 4, chloride 98, CO2  content 24, glucose of 528, BUN was 10, and creatinine 0.72.  Hepatic  function was normal.  CBC revealed hemoglobin of 14.5, hematocrit 43.2,  and white blood cell count 7400.   Hemoglobin A1c 15.8.  Lipid profile revealed a serum cholesterol of 266.  The patient's LDL cholesterol was 204.  Homocysteine level was 13.1  within the normal range.  A BNP performed on October 28, 2007, revealed a  serum sodium 138, potassium 4.0, chloride 108, CO2 27, glucose 137,  creatinine 0.77, and calcium 9.1.  The patient's CT scan of the brain  performed October 23, 2007, was no acute intracranial findings, minimal  chronic paranasal sinusitis.   HOSPITAL COURSE:  The patient was admitted to the hospital after MRI  scan revealed evidence of stroke.  She was treated for the same.  A  consultation through neurologist was obtained.  The patient was seen by  Dr. Vickey Huger evaluated  her and made adjustments in the patient's  medication.  The patient had mobile weakness and clumsiness of the left  upper extremity; however, after several days of hospitalization, this  began to improve with some physical therapy.  She had a carotid duplex  scan, which was no internal carotid artery stenosis appreciated.  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  She also had a 2-D echo scan, which is still currently  pending.  She had no trouble swallowing and she was continued on a diet  appropriate for her diabetes and high blood pressure.   On October 25, 2007,  she continued to complain of left arm weakness, which  did not improve until several days later; however, the patient's  strength began to slowly come back because of this was not as  severe.  With time, the patient's symptomatology seem to improve and was felt  best that she would benefit from inpatient rehabilitation.  The  patient's stroke has affected her left side of her body with some  weakness, but she is now currently able to ambulate with a walker.  The  patient has improved to the point where she will be discharged on the  following medications:  1. Amaryl 4 mg p.o. daily.  2. Lantus insulin 20 units subcu h.s.  3. Metformin 500 mg p.o. b.i.d.  4. Niacin 5 mg p.o. h.s.  5. Benicar Cough 20 mg p.o. daily.  6. Zocor 40 mg p.o. daily.  She will be followed by the rehab      physicians as seen by me in the office in approximately 2-3weeks.   Overall disposition at discharge, overall condition improved. __________  stroke.  Her return to work is questionable at this time.      Osvaldo Shipper. Spruill, M.D.  Electronically Signed     JOS/MEDQ  D:  10/29/2007  T:  10/30/2007  Job:  [id number]

## 2010-08-09 NOTE — Assessment & Plan Note (Signed)
[name] [name] is back regarding her right CVA.  She has done better with the  Voltaren gel and ice to her left biceps.  She walks with her cane in  hand, but does not usually put it to the ground, unless she looses her  balance.  She is not working, but hoping to getting some part-time work  sometime soon when the position she is waiting for opens up.  She had an  ankle twinge other day when walking in her socks but this has been mild.  She is sleeping well.  Her mood has been good.   REVIEW OF SYSTEMS:  Notable for the above as well as some dizziness.  Full 14-point review of systems is in the written health and history  section of the chart.   SOCIAL HISTORY:  The patient is widowed and lives alone.   PHYSICAL EXAMINATION:  VITAL SIGNS:  Blood pressure 127/78, pulse 83,  respiratory rate 18, and she is sating 99% on room air.  GENERAL:  The patient is pleasant, alert, and oriented x3.  Affect is  bright and appropriate.  MUSCULOSKELETAL:  She tends to shift a little bit more to the right with  her gait, has some pelvic asymmetry but seems to compensate very well.  She did better when cued to balance her posture.  Strength on the left  side is 4+/5.  Sensation remains decreased at 1/2.  Visuospatially, she  is doing very well to the left.  Cognitively she is intact.  HEART:  Regular.  CHEST:  Clear.  ABDOMEN:  Soft and nontender.   ASSESSMENT:  1. 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  2. Type 2 diabetes.  3. Left rotator cuff syndrome/bicipital tendinitis.  4. History of carpal tunnel syndrome.   PLAN:  1. Continue with conservative treatments for left arm and leg.      Recommended wearing shoes when she walks for ankle support and foot      cushioning.  2. Maintain Neurontin 300 mg t.i.d.  3. Recommended cane when walking.  4. I will see her back in 6 months' time.  She has done very nicely to      this point.      Ranelle Oyster, M.D.  Electronically Signed     ZTS/MedQ  D:  11/13/2008 13:25:04  T:  11/14/2008 04:22:08  Job #:  [id number]   cc:   Osvaldo Shipper. Spruill, M.D.  Fax: [phone]

## 2010-08-09 NOTE — Consult Note (Signed)
NAME:  [name] [name], [name] [name]              ACCOUNT NO.:  [id number]   MEDICAL RECORD NO.:  [id number]          PATIENT TYPE:  INP   LOCATION:  1445                         [facility]ACILITY[facility]   PHYSICIAN:  Melvyn Novas, M.D.  DATE OF BIRTH:  Nov 22, 1950   DATE OF CONSULTATION:  10/24/2007  DATE OF DISCHARGE:                                 CONSULTATION   REASON FOR CONSULTATION:  Right internal capsule infarction.   HISTORY OF PRESENT ILLNESS:  This is a pleasant 60 year old African  American female who on October 23, 2007, went to work.  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  She noticed at around 9 a.m. that she  started to walk differently.  The patient states that upon walking, she  noticed that she veered to the left and had difficulty keeping her  balance and felt as though her left leg was giving out from underneath  her.  At that time she had no numbness or tingling, no problems with  speech.  At 9:30 a.m. the patient noticed that she was getting dizzy and  felt swimmy in the head.  The patient got relieved from work and got  in her car and started driving towards the nearest EMS.   Upon getting to [facility], the patient got out of the car and  talked to some of the EMS Faculty, who did not notice anything abnormal  about the patient, but was worried that she might have suffered a  stroke.  The patient was brought to the [facility] Emergency  Room immediately, where a CT scan was taken which showed negative for  infarction.  The patient was admitted at that time.  The patient states  that later that evening while in the hospital, she noticed that her left  arm and left leg became heavy and numb.  Again, she had no problems with  speech, vision, her auditory or vestibular system.  The patient notes  that every time she would try to get out of bed, she was unable to  control her left leg.  At that time an MRI was taken of the patient's  head, which showed a small  internal capsule infarction on the right.  The patient states that at no time did she have any chest pain,  shortness of breath, decreased vision, problems speaking or mentation.  She felt as though her speech was fluid.  Her main complaint and signs  and symptoms were numbness and tingling in her left leg and left arm  with greater weakness in the left leg than the left arm.  The patient is  otherwise a healthy individual.   PAST MEDICAL HISTORY:  1. Hypertension.  2. Diabetes.  She states that she tries to keep good control of her      diabetes.   MEDICATIONS:  1. Avandia.  2. Lantus.   ALLERGIES:  No known drug allergies.   SOCIAL HISTORY:  The patient is employed and works as a Health and safety inspector.  She does not smoke.  Does not drink.  Does not do any illicit drugs.  She tries to drink plenty of water during the day.   REVIEW OF SYSTEMS:  GENERAL:  The patient denies any general weight  change, fever or weakness.  HEMATOLOGIC:  Denies any anemia, increased  bleeding or coagulopathy.  HEENT:  Denies any trauma, nausea, vomiting,  dizziness other than perceived within the last two days.  No  lightheadedness other than perceived in the last two days.  No diplopia,  decreased vision or tinnitus or hearing loss.  RESPIRATORY:  She denies  any cough, shortness of breath, asthma or bronchitis.  CARDIOVASCULAR:  She does have hypertension but denies palpitations, angina, dyspnea on  exertion or syncope.  GI:  She denies any diarrhea, constipation or  abdominal pain.  GENITOURINARY:  She denies any polyuria, dysuria,  nocturia, urgency, polydipsia or loss of bowel or bladder.  ENDOCRINE:  She does have diabetes.  Denies any increase or decrease in hunger.  VASCULAR:  Denies any claudication, PAD or leg edema.  MUSCULOSKELETAL:  Denies any joint pains, stiffness or arthritis.  NEUROLOGIC:  Other than  recently, she denies any numbness, tingling, blackouts or tremors.  PSYCHIATRIC:  She  denies any anxiety, depression or decrease in memory.   PHYSICAL EXAMINATION:  GENERAL:  This is a pleasant 60 year old [place]  American female.  VITAL SIGNS:  Blood pressure 143/88, pulse 95, respirations 18,  temperature 98.3 degrees.  MENTAL STATUS/NEUROLOGIC:  She is alert and oriented.  She is able to  carry out two and three-step commands.  She is able to recall three  objects.  Her mentation is within normal limits.  She has good  articulation and good sentence structure.  Cranial nerves:  Pupils  equal, reactive to light and accommodation.  Conjugate gaze.  Extraocular muscles intact.  Visual fields intact.  She has horizontal  nystagmus at the end of her gaze on the right and left.  Face is  asymmetrical with a slight facial droop on the left.  Tongue midline.  Uvula midline.  Sensation decreased over the V3 portion of her facial  nerve.  Shoulder shrug is decreased on the left side and her head turn  strength is decreased on the left.  Coordination:  Finger-to-nose normal  on the right, decreased on the left secondary to strength.  Heel-to-shin  normal on the right, decreased on the left, secondary to strength.  Fine  motor movement:  She has decreased fine motor movement on the left hand  and she has positive orbiting, right over left.  Her gait was unable to  be visualized, secondary to the patient being severely unsteady due to  weakness of her left leg.  It took two people to hold her steady in a  standing position.  Motor:  Left head turn, shoulder shrug, biceps  flexion, triceps extension, wrist flexion and extension were all 4/5.  Left hip extension, flexion 3/5.  Left knee extension is 4/5.  Left knee  flexion is 3/5.  Left dorsiflexion 3/5.  Left plantar flexion  4/5.  Right thigh is 5/5 globally.  She has a positive drift in her left arm  and left leg.  Negative for her right arm and right leg.  Reflexes:  She  has downgoing toes on her right and upgoing toes on her  left.  She has  0/4 deep tendon reflexes in her Achilles, 1/4 deep tendon patellar  reflex on her left.  All else is 2/4.  LUNGS:  Clear to auscultation bilaterally.  CARDIOVASCULAR:  S1 and  S2 audible.  A regular rate and rhythm.  NECK:  Negative for bruit, supple.  HEENT:  As stated, sensation decreased over facial nerve V2 and V3.  EXTREMITIES:  Decreased over left arm and left leg to pinprick and light  touch.  Intact globally to vibration.   LABORATORY DATA:  HPA1 is 15.8.  Cholesterol 266, HDL 42, LDL 204.  Urinalysis negative.  Sodium 131, potassium 4, chloride 98, bicarb 24,  BUN 10, creatinine 0.72.  On admission her blood glucose was 528.  No  current blood glucose at this time.  Hemoglobin 14.5, hematocrit 43.2,  white blood cells 7.4, platelets 315.   IMAGING:  MRI of the head showed a small area of acute infarction in the  posterior limb of the right internal capsule.  CT was negative.   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  The patient is now outside the three-hour window for TPA, so will be  kept n.p.o. until swallowing evaluation.  At this time we will order  transcarotid Dopplers, MRA of the head, transcranial Doppler,  homocysteine level and fasting lipid profile.  Daily aspirin 81 mg.  PT  for gait and OT for activities of daily living.   Will continue to follow the patient while in-house.  Dr. Janalyn Shy P. Sethi  and Dr. Porfirio Mylar Dohmeier will follow up with my examination.     ______________________________  Felicie Morn, P.A.    ______________________________  Melvyn Novas, M.D.    DS/MEDQ  D:  10/24/2007  T:  10/24/2007  Job:  [id number]

## 2010-08-09 NOTE — Assessment & Plan Note (Signed)
[name] [name] is back regarding her right CVA.  She saw her neurologist 1 or 2  weeks ago who placed her on Neurontin for dysesthesias.  We had talked  briefly about these at last visit, but symptoms were more hand based.  She complains of shoulder symptoms now.  She rates her pain 8/10.  She  is on the Neurontin which she is having to pay for out of her pocket at  100 mg t.i.d. schedule currently.  She was released from her job and now  is unemployed and does not have insurance.  She is applying for  disability.  She rates her pain 8/10.  Pain is sharp, tingling, aching.  She complains of her leg feeling heavy, the ankles sometimes sore when  she walks.  She has fallen and twisted her ankle.  She continues with  medications for blood pressure and diabetes per Dr. Shana Chute.  Complains  are general fatigue.  She is sleeping better overall, however.   REVIEW OF SYSTEMS:  Notable for numbness, tingling, diarrhea.  Other  pertinent positives are above.  Full 14-point review is in the written  health and history section.   SOCIAL HISTORY:  The patient is widowed and lives alone currently.   PHYSICAL EXAMINATION:  Blood pressure 126/52, pulse is 93, respiratory  rate 18, sating 99% on room air.  The patient is pleasant, alert and  oriented x3.  Affect is bright and appropriate.  Gait is slightly wide  based and off-kilter with her favoring the left leg in stance.  The left  knee has a valgus deformity with stance as well.  She has decreased fine  touch in arm and leg today on the left side.  Left shoulder is painful  with rotator cuff impingement maneuvers.  She has some tenderness along  the deltoid as well.  Left hand is positive for numbness, but no Tinel  sign was appreciated.  She had good strength; however, left upper and  left lower extremities today at 4+-5/5.  Cognitively, she is intact.  Cranial exam showed no obvious deficits today except for left facial  numbness.  Heart is regular.   Chest is clear.  Abdomen is soft,  nontender.   ASSESSMENT:  1. 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  2. Type 2 diabetes.  3. Hypertension.  4. Dyslipidemia.  5. Left rotator cuff syndrome.  6. History of carpal tunnel.   PLAN:  1. I think the patient's symptoms in the left upper extremity are      combination of her neuropathic pain from the stroke as well as some      residual carpal tunnel and rotator cuff signs at the shoulder.      After informed consent, we injected the left shoulder today with 40      mg Kenalog and 3 mL of 1% lidocaine.  The patient tolerated it      well.  2. Continue with the Neurontin per neurology recommendations.  3. Reviewed some shoulder exercises with the patient to improve      strength and flexibility.  4. We talked about some of the realities of her stroke with likely      long-term sensory deficits.  5. I will see her back in about 4 months' time.  She needs to follow      up with Dr. Shana Chute for blood pressure and glucose monitoring.      Ranelle Oyster, M.D.  Electronically Signed     ZTS/MedQ  D:  04/24/2008 12:56:19  T:  04/25/2008 04:51:34  Job #:  [id number]   cc:   Pramod P. Pearlean Brownie, MD  Fax: [phone]   Osvaldo Shipper. Spruill, M.D.  Fax: [phone]

## 2010-08-09 NOTE — Discharge Summary (Signed)
NAMENEKO, MCGEEHAN NO.:  [id number]   MEDICAL RECORD NO.:  [id number]          PATIENT TYPE:  IPS   LOCATION:  4005                         FACILITY:  [facility]   PHYSICIAN:  Ranelle Oyster, M.D.DATE OF BIRTH:  05-23-1950   DATE OF ADMISSION:  10/30/2007  DATE OF DISCHARGE:  11/02/2007                               DISCHARGE SUMMARY   DISCHARGE DIAGNOSES:  1. Right cerebrovascular accident with left hemiparesis.  2. Diabetes mellitus type 2.  3. Hypertension.  4. Dyslipidemia.   HISTORY OF PRESENT ILLNESS:  [name] [name] is a 60 year old female with a  history of diabetes mellitus and hypertension admitted to [facility] October 04, 2007, with tendency to veer to the left and left lower  extremity instability.  CT of head then showed no acute changes.  The  patient was noted to have left upper extremity and left lower extremity  heaviness and numbness last admission.  MRI and MRA of brain showed  small area of infarct in the right posterior internal capsule with  atherosclerosis at left carotid bifurcation.  Carotid Doppler showed  right 40%-60% ICA stenosis at bifurcation.  Neuro was consulted for  input and recommended aspirin for CVA prophylaxis.  The patient  currently continues to have dysmetria in the left upper extremity with  problems to her left knee hyperextension with mobility.  Rehab was  consulted for further therapies.   PAST MEDICAL HISTORY:  1. Hypertension.  2. DM type 2, fully controlled.  3. History of SVT status post ablation in 2001.   ALLERGIES:  DEMEROL.   FAMILY HISTORY:  Positive for hypertension.   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  Plan is for discharging her following  a 24-hour supervision past discharge.  She has a 5-year tobacco use  history, quit many years ago, has not used any alcohol.   FUNCTIONAL HISTORY:  The patient was independent in walking  prior to  admission.  Functional status, the patient is currently at min-to-guard  assist, ambulating 140 feet with a rolling walker with a supervision for  transfers and ADLs.   PHYSICAL EXAMINATION AT ADMISSION:  GENERAL:  A well-nourished, well-  developed female, no dysarthria, and in no acute distress.  HEENT:  Extraocular movements intact.  Pupils equal, round, and reactive  to light.  Moist mucosa.  Tongue midline.  Teeth, fair dentition.  NECK:  Supple without masses.  LUNGS:  Clear to auscultation bilaterally.  No wheezes and no crackles.  HEART:  Regular rate and rhythm.  No murmurs.  ABDOMEN:  Soft and nontender with positive bowel sounds.  EXTREMITIES:  No evidence of edema or cyanosis, mild left-sided  weakness.  SKIN:  Intact without breakdown.  NEUROLOGIC:  The patient is alert and oriented x3, no dysarthria.  Left  upper extremity ataxia with finger-to-nose, left lateral nystagmus  noted, weakness at left hip flexors versus greater than extensors with  sensation mildly decreased on the left arm and left leg.  Strength  generally 5/5 right upper extremity, 4+/5  left upper, right lower  extremity strength was 5/5, and left lower extremity strength 2+/5  proximally, 4/5 distally.   HOSPITAL COURSE:  [name] was admitted to Rehab on October 30, 2007, for inpatient therapies to consist of at least 3 hours PT/OT.  Rehab nursing has been following for bowel and bladder training as well  as skin care monitoring.  They have also worked with the patient  regarding input and diabetes.  The patient was noted to have issues with  severe constipation and bowel program was initiated by nursing.  ID was  consulted to help the patient increase knowledge of nutritional therapy  for diabetes management.   Labs done past admission revealing a sodium of 137, potassium 4.3,  chloride 104, CO2 27, BUN 17, creatinine 0.8, glucose 143.  CBC then  revealed hemoglobin 13.5,  hematocrit 40.9, white count 5.7, and  platelets 269.  The patient was maintained on Lantus insulin 20 units  subcu nightly, Glucophage was increased to 1000 mg p.o. b.i.d. at  admission with Amaryl 4 mg per day.  The patient is instructed to check  blood sugars on at least t.i.d. basis past discharge.  As the sugars  start trending down to 100-120 range, she could decrease her Lantus  insulin by 5 units.  Secondary to elevated hemoglobin A1c and further  need for management of this diabetes, she is advised to follow up with  Dr. Shana Chute in 2 weeks past discharge.  Blood pressures have been  monitored on t.i.d. basis during this stay.  These are reasonably  controlled ranging from 120s-130s systolic, 70-80s diastolic.   At the time of admission, the patient was overall at supervision and  with full mobility with decrease in coordination, decrease in sensation,  and decrease in balance.  Physical therapy has worked with patient, and  currently the patient is advanced to modified independent for household  level, ambulation with rolling walker, close supervision for ambulating  using a rolling walker at community levels, able to navigate 3 steps at  min assist, modified independent for transfers.  In terms of OT, the  patient was noted to have decrease in use of left upper extremity with  decreased coordination at the time of admission.  OT has worked with the  patient in helping increase her activity tolerance as well as increase  her functional use of left upper extremity and left lower extremity.  At  the time of discharge, the patient is overall modified independent to  supervision for self gait.  She was noted to have loss of balance x1;  but, however, was able to self-correct without difficulty.  The patient  will continue to receive further followup outpatient PT at [facility] beginning on November 05, 2007.  On November 02, 2007, the  patient is discharged to home with  family to provide supervision.   DISCHARGE MEDICATIONS:  1. Coated aspirin 325 mg a day.  2. Amaryl 4 mg a day.  3. Glucophage 1000 mg b.i.d.  4. Niacin 500 mg nightly.  5. Zocor 40 mg a day.  6. Foltx 1 p.o. for day.  7. Lantus insulin 20 units nightly.  8. Diovan 160 mg a day.   DIET:  Diabetic diet.  Check blood pressures 2 to 3 times a day and  record.  If blood sugars start growing to 100-120s range, decrease  Lantus to 15 units nightly.   ACTIVITY:  Intermittent supervision, no strenuous activity, no  alcohol,  no smoking, and no driving.   SPECIAL INSTRUCTIONS:  [facility] beginning November 05, 2007, at 3:45 p.m.   FOLLOWUP:  The patient to follow up with Dr. Riley Kill on December 04, 2007, at 9:30 a.m., for 10 a.m. appointment to follow up with Dr.  Shana Chute in 2 weeks, and followup with Dr. Vickey Huger, Neurology in 4  weeks.     ______________________________  Arletha Grippe, M.D.      Ranelle Oyster, M.D.  Electronically Signed    MJK/MEDQ  D:  11/04/2007  T:  11/05/2007  Job:  [id number]   cc:   Osvaldo Shipper. Spruill, M.D.  Melvyn Novas, M.D.

## 2010-08-09 NOTE — Assessment & Plan Note (Signed)
[name] [name] is back regarding her right CVA with left hemiparesis.  She is discharged home with family.  She is in outpatient therapy  currently and doing quite well.  She denies any pains or aches.  She  occasionally has some paresthesias on the arm and leg and in the face,  but they are minimal.  She is walking with and without a cane currently.  PT hopes to work with her another month.  The patient also would like to  go back to work.  She is working in the daycare/nutrition.  The patient  was driving prior to the stroke.  She has been living with the family,  but just staying at home for a period of the day and has had no  problems.   REVIEW OF SYSTEMS:  Notable for some weight loss.  She denies any other  issues other than those mentioned above.  Full review is in the written  health and history section of the chart.   SOCIAL HISTORY:  The patient is single and family is supportive.  Daughter is with her today.   PHYSICAL EXAMINATION:  VITAL SIGNS:  Blood pressure is 126/76, pulse is  84, respiratory rate 18, and she is sating 100% on room air.  GENERAL:  The patient is pleasant, alert, and oriented x3.  Affect is  bright and appropriate.  Gait is slightly wide based and slow.  She  favors the left leg in gait and the left knee tends to buckle a bit and  stance.  She overall has fair weight shift.  She is able to transfer  without using her arms.  Reflexes are 2+ on the left and 1+ on the  right.  Sensation is 1+/2 on the left arm, leg, and face.  Cranial nerve  exam reveals no focal abnormalities with full visual fields.  She has no  intention to the left.  Cognitively, she is intact.  Strength is 4+ to  5/5 on the right and 4+/5 on the left today.  Fine motor coordination is  a bit slow in the left upper extremity and leg today, but minimally so.  HEART:  Regular.  CHEST:  Clear.  ABDOMEN:  Soft and nontender.   ASSESSMENT:  1. Right cerebrovascular accident with left  hemiparesis, maybe sensory      loss.  2. Type 2 diabetes.  3. Hypertension.  4. Dyslipidemia.   PLAN:  1. Continue with outpatient PT and OT as she is doing.  She is making      nice progress.  2. Gave the patient information to transition to home independently.      I would also like her to begin car driving trial with the family as      well, which we outlined in detail.  3. I think ultimately she can go back to work over the next few months      that she should choose.  4. Continue folic acid as well as niacin as per her discharge orders.      Also, continue Zocor 40 mg daily.  5. I will see her back in about 3 months.  I am pleased with her      progress.      Ranelle Oyster, M.D.  Electronically Signed     ZTS/MedQ  D:  12/04/2007 10:18:14  T:  12/05/2007 01:02:56  Job #:  [id number]   cc:   Osvaldo Shipper. Spruill, M.D.  Fax: [phone]

## 2010-08-12 NOTE — H&P (Signed)
[name] [name], [name] [name]              ACCOUNT NO.:  [id number]   MEDICAL RECORD NO.:  [id number]          PATIENT TYPE:  INP   LOCATION:  4705                         FACILITY:  [facility]   PHYSICIAN:  Fleet Contras, M.D.    DATE OF BIRTH:  [birth year]   DATE OF ADMISSION:  06/02/2005  DATE OF DISCHARGE:  06/05/2005                                HISTORY & PHYSICAL   PRESENTING COMPLAINT:  Chest tightness, chest discomfort.   HISTORY OF PRESENTING COMPLAINT:  [name] [name] is a 60 year old African-  American lady with past medical history significant for type 2 diabetes  mellitus, hypertension, dyslipidemia, and arthritis of the knees. She  presents to the emergency room at [facility] with complaints of 1-  week episode of chest tightness on and off. She said she has been under  enormous stress recently. She is dealing with her granddaughter being in  some legal problems and this has brought on these episodes of shortness of  breath and chest tightness. She had no definite chest pain or chest  pressure. She denied any nausea, diaphoresis, orthopnea, PND, or  palpitations. She stated that she had not been taking her Lopressor or  diabetes medications for about a month because she ran out and she has not  seen a physician in over a year. At the emergency room she was started on  intravenous nitroglycerin which immediately relieved her pain and the  initial test of CKs and troponins were negative. Her blood pressure was  markedly elevated and her sugar was over 500. She was therefore admitted to  the hospital for further evaluation and appropriate therapy.   PAST MEDICAL HISTORY:  1.  Type 2 diabetes mellitus.  2.  Systemic hypertension.  3.  Dyslipidemia.  4.  Arthritis of knees.  5.  Osteopenia.   PAST MEDICATION HISTORY:  She is supposed to be on:  1.  Glucovance 5/500 one p.o. b.i.d.  2.  Avandia 8 mg daily.  3.  Aspirin 325 mg daily.  4.  Diovan HCT 80/12.5 one daily.  5.   Lantus insulin 20 units at bedtime.   She is allergic to DEMEROL which causes hives.   PAST SURGICAL HISTORY:  She has a history of knee arthroscopy, hysterectomy,  bilateral carpal tunnel release. Her last cardiac catheterization was in  2001. Colonoscopy was in August 2002.   FAMILY AND SOCIAL HISTORY:  She denies any use of alcohol, tobacco, or  illicit drugs. Her father died of a heart attack and her mother died of  stroke. She has one brother and two sisters in good health. She also has two  children in good health.   REVIEW OF SYSTEMS:  GENERAL:  She denies any weight loss, loss of appetite,  jaundice, or fevers. CNS:  She has no headaches, dizziness, blurring of  vision, or weakness of extremities. CV:  As stated above. RESPIRATORY:  She  has no cough, cold, sputum production, or hemoptysis. GU:  She has no  dysuria, frequency, or hematuria. GI:  She denies abdominal pain, nausea,  vomiting, diarrhea, or constipation.   PHYSICAL  EXAMINATION:  VITAL SIGNS:  Blood pressure was 137/80, heart rate  78, respiratory rate 18, temperature 97.9, O2 saturation on room was 100%.  GENERAL:  She is lying comfortably in bed, not in acute respiratory or  painful distress. She is not pale, she is not icteric, she is not cyanotic.  NECK:  Supple with no elevated JVD.  CHEST:  Shows good air entry bilaterally with no rales, no rhonchi or  wheezes.  HEART:  Heart sounds 1 and 2 are heard with no murmurs. There are no carotid  bruits. Peripheral pulses are present bilaterally.  ABDOMEN:  Soft, nontender, no masses, bowel sounds are present. There are no  abdominal bruits.  EXTREMITIES:  Show no edema, no calf tenderness or swelling.   INITIAL LABORATORY DATA:  Included portable chest x-ray which shows no acute  cardiopulmonary disease. A CT scan of the chest was done. This revealed no  evidence of pulmonary embolism. Her initial CK and troponin are within  normal limits. Creatinine is 0.8. CBC  is within normal limits.   ASSESSMENT:  [name] [name] is a 60 year old African-American lady with multiple  coronary artery disease risk factors. She has been admitted from the  emergency room at [facility] with chest tightness, shortness of  breath, and chest discomfort. Because of her cardiac risk factors and  abnormal blood pressure and blood sugar readings on admission she is being  admitted to the hospital for further evaluation and therapy.   PLAN OF CARE:  She will be admitted to a telemetry bed. She will be on a 2 g  sodium, carbohydrate-modified moderate diet. Vital signs will be checked  q.4h. CBGs will be checked a.c. and h.s. and she will be on a sliding scale  NovoLog insulin per moderately-sensitive protocol. Her home medication will  be resumed except for metformin should she require contrast studies, and she  also had a contrast procedure performed in the emergency room to rule out  pulmonary embolism. She will be on IV fluids with half normal saline at 100  mL an hour. Her pain will be controlled with sublingual nitroglycerin as  needed for chest pain. Further cardiac evaluation will be done with serial  CK and troponin to rule out acute coronary syndrome. Cardiology consult will  be requested if she has any suspicion of ongoing coronary artery disease;  otherwise, she will have an outpatient cardiac evaluation performed. This  plan of care has been discussed with her and her questions have been  answered.      Fleet Contras, M.D.  Electronically Signed     EA/MEDQ  D:  06/05/2005  T:  06/06/2005  Job:  [id number]

## 2010-08-12 NOTE — Discharge Summary (Signed)
NAME:  [name] [name], [name] [name] NO.:  [id number]   MEDICAL RECORD NO.:  [id number]          PATIENT TYPE:  INP   LOCATION:  4705                         FACILITY:  [facility]   PHYSICIAN:  Fleet Contras, M.D.    DATE OF BIRTH:  08-01-1950   DATE OF ADMISSION:  06/02/2005  DATE OF DISCHARGE:  06/05/2005                                 DISCHARGE SUMMARY   HISTORY OF PRESENT ILLNESS:  [name] [name] is a 60 year old African American  female with past medical history significant for type 2 diabetes mellitus,  hypertension, noncomplianceand with complaints of pressure-like chest pain  associated with some dizziness, sweating.  This occurred while at work.  She  denied any shortness of breath or palpitations or sublingual PND.  In the  emergency room, her blood sugar was elevated around 300.  Her blood pressure  was also elevated.  She was therefore and admitted and treated for control  of her diabetes and hypertension as well as to rule out acute coronary  syndrome.   HOSPITAL COURSE:  The patient was placed on telemetry bed.  She was on  sliding scale NovoLog insulin.  Home medications including Glucophage and  Lantus insulin were continued.  She was started on Lopressor 25 mg b.i.d.  and aspirin 325 mg daily for DVT prophylaxis.  CR, CK, troponins and EKG  were performed and this ruled out acute MI syndrome.  She did not have any  subsequent pain after admission and on June 05, 2005, she was found to have  no further chest pain, no shortness of breath.  Vital signs were stable.  Chest was clear to auscultation.  Abdomen was benign.  Bowel sounds were  present.  Extremities showed no edema.  She was elected to be stable.She was  therefore considered stable for discharge home.   DISCHARGE DIAGNOSES:  1.  Chest pain, MI ruled out.  2.  Type 2 diabetes mellitus.  3.  Controlled hypertension.   DISPOSITION:  Home.   FOLLOWUP:  Follow up with me in 1-2 weeks.   DISCHARGE  MEDICATIONS:  1.  Aspirin 325 mg daily.  2.  Diovan HCT 80 mg x5 days.  3.  Lantus insulin 20 units q.h.s.  4.  Avandia 4 mg daily.  5.  Amaryl 4 mg daily.  6.  Prilosec 20 mg b.i.d.  7.  Metformin 500 mg b.i.d.  8.  Lipitor 20 mg daily.  9.  Amaryl 4 mg in the a.m.   Her plan of care was discussed with the patient and all questions were  answered.  SHe will follow up with me in 2 weeks.  Disposition is to home.  Condition on discharge is stable.      Fleet Contras, M.D.  Electronically Signed     EA/MEDQ  D:  07/10/2005  T:  07/10/2005  Job:  [id number]

## 2010-08-12 NOTE — Op Note (Signed)
[place]  Patient:    [name] [name], [name] [name]                     MRN: [id number] Proc. Date: 01/09/00 Adm. Date:  [id number] Attending:  Nathen May CC:         Charlaine Dalton. Sherene Sires, M.D. [facility]  Electrophysiology Laboratory   Operative Report  PREOPERATIVE DIAGNOSIS:  Supraventricular tachycardia.  POSTOPERATIVE DIAGNOSIS:  A-V nodal reentry tachycardia.  PROCEDURE:  Invasive electrophysiological study, arrhythmia mapping, drug infusion and radiofrequency catheter ablation.  OPTICAL DISK:  151-A.  COMPLICATIONS:  None apparent.  Following the obtainment of informed consent, the patient was brought to the electrophysiology laboratory and placed on the fluoroscopic table in the supine position.  After routine prep and drape, cardiac catheterization was performed with local anesthesia and conscious sedation.  Noninvasive blood pressure monitoring, transcutaneous oxygen saturation monitoring and end-tidal CO2 monitoring were performed continuously throughout the procedure. Following the procedure, the catheters were removed, hemostasis was obtained and the patient was transferred to the floor in stable condition.  CATHETERS:  A 5-French quadripolar catheter was inserted via the left femoral vein to the high right atrium.  A 5-French quadripolar catheter inserted via the left femoral vein to the A-V junction.  A 5-French quadripolar catheter was inserted via the left femoral vein to the right ventricular apex.  A 5-French decapolar catheter was inserted via the right femoral vein to the coronary sinus.  A 7-French 4 mm deflectable tip ablation catheter was inserted via the right femoral vein using Riley Kill (SR0) to mapping sites in the posterior septal space.  Surface leads I, aVF, and V1 were monitored continuously throughout the procedure.  Following insertion of the catheters, the stimulation protocol included incremental atrial  pacing.  Incremental ventricular pacing.  Single atrial extrastimuli from the coronary sinus at pace cycle lengths of 400 and 350 msec.  Single, double and triple atrial extrastimuli from the high right atrium and coronary sinus.  Single and double ventricular extrastimuli at pace cycle lengths of 400 and 350:500 msec from the right ventricular apex.  Isoproterenol infusions at various doses.  Lopressor infusion.  RESULTS:  SURFACE ELECTROCARDIOGRAM:        INITIAL                  FINAL  RHYTHM:                            Sinus                      Sinus CYCLE LENGTH:                     602 msec                    594 msec P-R INTERVAL:                     172 msec                    178 msec QRS DURATION:                     108 msec                    100 msec Q-T INTERVAL:  366 msec                    342 msec P-WAVE DURATION:                  105 msec                    100 msec PREEXCITATION:                    Absent                      Absent BUNDLE BRANCH BLOCK:              Absent                      Absent  A-V NODAL FUNCTION: A-H Interval:                      67 msec                 62 msec A-V Wenckebach:                   260 msec V-A Wenckebach:                   250 msec  A-V Nodal ERP:  Preablation and cycle length of 400 msec was 230 msec. Postablation it was assessed in different functional states related to combinations of Lopressor and isoproterenol.  At a paced cycle length of 500 msec, it was 290 msec.  Slow pathway conduction was very difficult to unmask.  On one occasion with triple atrial extrastimuli (see below), dual A-V nodal physiology was identified and tachycardia was induced.  A part from this reproducible demonstration of antegrade slow pathway function could not be demonstrated.  HIS PURKINJE SYSTEM FUNCTION: H-V Interval:                     50 msec                 59 msec His bundle duration:              15  msec                 17 msec  ACCESSORY PATHWAY FUNCTION:  No evidence of an accessory pathway was identified.  VENTRICULAR RESPONSE TO PROGRAMMED STIMULATION:  Normal as described above.  ARRHYTHMIAS INDUCED:  A-V nodal reentry tachycardia was induced on one occasion with pacing from the high right atrium at 400:230:230:210.  The tachycardia cycle length was 286 msec and was terminated with atrial pacing at 250 msec.  CHARACTERISTICS OF TACHYCARDIA: A. Initiated with marked prolongation of the antegrade A-V nodal conduction. B. During an episode of tachycardia, the A-H interval was 247 msec.  The A-H A    time was 46 msec and the VA interval was 0 msec. C. Postventricular pacing termination pattern was V-A-V. D. No evidence of atrial preexcitation during His bundle refractoriness was    identified with ventricular stimulation.  FLUOROSCOPY TIME:  A total of 10 minutes of fluoroscopy time was utilized at 7.5 to 15 frames per second.  RADIOFREQUENCY ENERGY:  A total of 54 seconds of radiowave energy was applied in two applications to sites where presumed slow pathway potentials were identified.  On each occasion, junctional tachycardia without evidence of VA  block was initiated.  With the second application after 30 seconds, the catheter was withdrawn approximately 5 mm to what was approximately the os of the coronary sinus and RF energy was delivered for another 25 seconds.  Following RF applications, attempts to reinduce tachycardia were unsuccessful. This was ______ of ______ define tachycardia only on one occasion in the hours preceding ablation because of difficulties with induction.  Various combinations of beta-blockers and isoproterenol were utilized to try to dissociate fast and slow pathway antegrade function without success.  Based on the junctional rhythm, further RF was not applied.   IMPRESSION: 1. Sinus tachycardia ______ question mechanism. 2. Normal atrial  function. 3. Dual antegrade A-V nodal physiology with very difficult to induce A-V nodal    reentry tachycardia.  Radiofrequency energy applied at the sites of    presumed slow pathway potentials resulting in junctional tachycardia    without evidence of VA block.  This was used as an endpoint because of    difficulties with induction, as well as, difficulties with unmasking    antegrade slow pathway function. 4. Normal His Purkinje system function with minimal prolongation of the H-V    interval postablation albeit with a A-V ratio of greater than one at final    sites. 5. No accessory pathway. 6. Normal response to ventricular stimulation.  SUMMARY/CONCLUSION:  The results of electrophysiological testing identified A-V nodal reentry tachycardia with great difficulty as the mechanism underlying Ms. Martins clinical symptoms and previously described electrocardiogram, which was consistent with this electrophysiological diagnosis.  RF energy was applied using junctional tachycardia as an endpoint because of the difficulties in unmasking slow pathway function.  The patient tolerated the procedure well.  The likelihood of recurrence is estimated in the range of 10-15%.  RECOMMENDATIONS: 1. Observe overnight. 2. Maintain antihypertensive therapy and will initiate Ace inhibitor therapy    to supplement her verapamil.  I will defer long-term management of this to    Dr. Sherene Sires. 3. Aspirin daily as she continued. 4. Endocarditis prophylaxis. DD:  01/09/00 TD:  01/09/00 Job: [id number]

## 2010-08-12 NOTE — Discharge Summary (Signed)
NAME:  [name] [name], WACHS NO.:  [id number]   MEDICAL RECORD NO.:  [id number]          PATIENT TYPE:  INP   LOCATION:  4705                         FACILITY:  [facility]   PHYSICIAN:  Fleet Contras, M.D.    DATE OF BIRTH:  03-29-1950   DATE OF ADMISSION:  06/02/2005  DATE OF DISCHARGE:  06/05/2005                                 DISCHARGE SUMMARY   PRESENTATION:  [name] [name] is a 59 year old African American lady with past  medical history significant for type 2 diabetes mellitus, hypertension,  dyslipidemia and arthritis of the knees. She presented to the emergency room  at [facility] where she was complaining of chest pain, chest  tightness, shortness of breath for 1 week duration on and off. She states  she has been under a lot of stress lately and assumed that this was what  brought on the chest pain. She has been off her medications for about a  month and had not seen a physician for about a year. On admission to the  emergency room, she was not in acute respiratory or painful distress. She  received intravenous nitroglycerin, with complete resolution of the pain and  was therefore changed to sublingual nitroglycerin. She was admitted to rule  out myocardial infarction and for further evaluation.   HOSPITAL COURSE:  On admission, the patient's CBG in the emergency room was  over 500, her blood pressure was elevated. She was admitted to a telemetry  bed. She received sublingual nitroglycerin, but she did not have any pain  throughout the course of admission. Most of her home medications were  resumed including Lantus insulin 20 units once at night, Lopressor 25 mg  b.i.d., Avapro 150 mg daily, hydrochlorothiazide 12.5 mg daily, Avandia 4 mg  daily, Amaryl 4 mg daily, metformin was put on hold after she had received  intravenous contrast for CT scan of the chest. This was negative for acute  pulmonary embolism. She did come down and her blood pressure improved.  As  mentioned above, she did not have any pain throughout the course of her  admission. Later laboratory data on June 04, 2005, showed a normal CBC,  sodium was 137, potassium 3.4, chloride 104, bicarbonate 26, BUN 10,  creatinine 0.7 and glucose 174. Serial CPK and troponin as well as EKG has  been negative for acute coronary syndrome. Lipid profile showed total  cholesterol of 190, LDL was 137, HDL of 36, triglycerides of 84. Hemoglobin  A1C was elevated at 15.7, TSH was 1.353. Today, she is feeling much better  and not in acute respiratory or painful distress.   PHYSICAL EXAMINATION:  VITAL SIGNS: Blood pressure 140/87, heart rate 92,  respiratory rate 19, temperature 98.3, O2 sats on room air was 99%.  NECK: Supple, no JVD.  CHEST: Clear to auscultation.  ABDOMEN: Benign.   CBG this morning was 156.   ASSESSMENT:  Stable for discharge today for outpatient cardiac work-up to  evaluate her coronaries.   DISCHARGE DIAGNOSES:  1.  Chest pain, acute coronary syndrome ruled out.  2.  Type 2 diabetes mellitus  poorly controlled with hemoglobin A1C of 15.7.      She has not been resumed on her regular home medications.  3.  Systemic hypertension. She is currently well controlled.  4.  Dyslipidemia.   PLAN:  The patient was discharged home.   FOLLOW UP:  Follow up will be with me in 1 week.   DISCHARGE MEDICATIONS:  1.  Lantus insulin 20 units q.h.s.  2.  Aspirin 325 mg daily.  3.  Diovan/HCT 80/12.5 daily.  4.  Avandia 4 mg daily.  5.  Amaryl 4 mg daily.  6.  Lopressor 25 mg b.i.d.  7.  Metformin 500 mg b.i.d.  8.  Sublingual nitroglycerin 0.4 mg to use 1 every 5 minutes x3 for chest      pain p.r.n.  9.  She will also be started in the office on Lipitor 20 mg once a day.   This plan of care has been explained to her and all of her questions  answered.      Fleet Contras, M.D.  Electronically Signed     EA/MEDQ  D:  06/05/2005  T:  06/06/2005  Job:  [id number]

## 2010-08-23 NOTE — Discharge Summary (Signed)
NAME:  [name] [name], [name] [name]              ACCOUNT NO.:  [id number]  MEDICAL RECORD NO.:  [id number]           PATIENT TYPE:  I  LOCATION:  5128                         FACILITY:  [facility]  PHYSICIAN:  Zola Button T. Lazarus Salines, M.D. DATE OF BIRTH:  [birth year]  DATE OF ADMISSION:  07/04/2010 DATE OF DISCHARGE:  07/07/2010                              DISCHARGE SUMMARY   CHIEF COMPLAINT:  Metastatic thyroid carcinoma.  HISTORY OF PRESENT ILLNESS:  A 60 year old black female now approximately 9 months status post total thyroidectomy for papillary thyroid carcinoma.  She has had rising thyroglobulin and CT scan showed adenopathy in the left neck.  Needle aspiration was positive for papillary thyroid carcinoma.  She is being admitted to the hospital for left neck dissection.  After her first surgery, she did nicely, although she did have a wound hematoma and a minor wound infection.  She has been managed with the assistance of Dr. Sharl Ma, endocrinology and did receive a radioactive iodine remnant ablation postoperatively.  She is doing nicely as far as her overall health.  PAST MEDICAL HISTORY:  Positive for diabetes, hypertension, stroke.  PRIOR SURGERIES:  Thyroidectomy in 2011.  SOCIAL HISTORY:  She is single.  She does not smoke.  Does not use alcohol.  ALLERGIES:  She is allergic to DEMEROL.  ADMISSION MEDICATIONS:  Exforge, Zocor, Lantus insulin, Folbic, Neurontin, Plavix, metformin, Niaspan, aspirin, niacin, and glimepiride.  REVIEW OF SYSTEMS:  Negative for tingling or muscle spasms.  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  She did not have any calcium metabolism issues after the previous surgery.  PHYSICAL EXAMINATION:  GENERAL:  This is a pleasant middle-aged black female.  Voice is strong and respirations unlabored. NECK:  She has a well-healed neck scar.  She has no palpable  adenopathy in her neck. LUNGS:  Clear to auscultation. HEART:  Regular rate and rhythm and no murmurs. ABDOMEN:  Soft and active. EXTREMITIES:  Normal configuration with minimal residual weakness following her stroke.  ADMISSION DIAGNOSIS:  Metastatic thyroid carcinoma, left neck.  Admission laboratory showed hemoglobin A1c of 7.0.  HOSPITAL COURSE:  On the day of her hospitalization, she was taken to the operating room where a conservation left neck dissection, zones 2, 3, 4, and 5 were performed.  Level 6 could not be adequately explored. Postoperatively, the patient was observed in the Intermediate Care Unit. Her glucose remained well controlled.  Calcium was 8.0.  Preoperative TSH was 0.029.  She had mild wound drainage and otherwise no difficulties.  Calcium was 8.3 on the second postoperative day and she had a resumed good p.o. diet.  Decreasing wound drainage.  On the third postoperative day with everything stable, the drains were removed and the patient was discharged home.  Pathology report at this time did show 3 positive nodes with no extracapsular spread in levels 2, 3, 4/5.  Once again  FINAL DIAGNOSIS:  Metastatic thyroid carcinoma, left neck.  PROCEDURE PERFORMED:  Left conservation neck dissection, April 9.  COMPLICATIONS:  None.  CONDITION ON DISCHARGE:  Ambulatory, pain controlled, voice good, taking full p.o. diet.  Recheck in 5 days for suture removal.  Prescriptions Vicodin.  She was given instructions regarding wound care and advancement of diet and activity.     Gloris Manchester. Lazarus Salines, M.D.     KTW/MEDQ  D:  07/27/2010  T:  07/27/2010  Job:  [id number]  cc:   Tonita Cong, M.D. Osvaldo Shipper. Spruill, M.D.  Electronically Signed by Flo Shanks M.D. on 08/23/2010 10:44:28 AM

## 2010-08-25 ENCOUNTER — Encounter (HOSPITAL_COMMUNITY)
Admission: RE | Admit: 2010-08-25 | Discharge: 2010-08-25 | Disposition: A | Discharge status: Home / Self Care | Payer: PRIVATE HEALTH INSURANCE | Source: Ambulatory Visit | Attending: Internal Medicine | Admitting: Internal Medicine

## 2010-08-25 DIAGNOSIS — C73 Malignant neoplasm of thyroid gland: Secondary | ICD-10-CM | POA: Insufficient documentation

## 2010-08-26 MED ORDER — SODIUM IODIDE I 131 CAPSULE
192.0000 | Freq: Once | INTRAVENOUS | Status: AC | PRN
  Administered 2010-08-26: 192 via ORAL

## 2010-09-05 ENCOUNTER — Other Ambulatory Visit (HOSPITAL_COMMUNITY): Payer: Self-pay | Admitting: Internal Medicine

## 2010-09-05 DIAGNOSIS — C73 Malignant neoplasm of thyroid gland: Secondary | ICD-10-CM

## 2010-09-06 ENCOUNTER — Ambulatory Visit (HOSPITAL_COMMUNITY)
Admission: RE | Admit: 2010-09-06 | Discharge: 2010-09-06 | Disposition: A | Discharge status: Home / Self Care | Payer: PRIVATE HEALTH INSURANCE | Source: Ambulatory Visit | Attending: Internal Medicine | Admitting: Internal Medicine

## 2010-09-06 DIAGNOSIS — C73 Malignant neoplasm of thyroid gland: Secondary | ICD-10-CM | POA: Insufficient documentation

## 2010-12-23 LAB — GLUCOSE, CAPILLARY
Glucose-Capillary: 164 — ABNORMAL HIGH
Glucose-Capillary: 177 — ABNORMAL HIGH
Glucose-Capillary: 195 — ABNORMAL HIGH
Glucose-Capillary: 280 — ABNORMAL HIGH
Glucose-Capillary: 368 — ABNORMAL HIGH
Glucose-Capillary: 119 — ABNORMAL HIGH
Glucose-Capillary: 125 — ABNORMAL HIGH
Glucose-Capillary: 129 — ABNORMAL HIGH
Glucose-Capillary: 136 — ABNORMAL HIGH
Glucose-Capillary: 141 — ABNORMAL HIGH
Glucose-Capillary: 145 — ABNORMAL HIGH
Glucose-Capillary: 147 — ABNORMAL HIGH
Glucose-Capillary: 147 — ABNORMAL HIGH
Glucose-Capillary: 157 — ABNORMAL HIGH
Glucose-Capillary: 213 — ABNORMAL HIGH
Glucose-Capillary: 241 — ABNORMAL HIGH

## 2010-12-23 LAB — CBC
HCT: 43.2
HCT: 40.5
Hemoglobin: 14.5
MCHC: 33
MCHC: 33.5
MCV: 81.4
MCV: 82.1
Platelets: 315
Platelets: 269
RBC: 4.97
RBC: 4.98
RDW: 13.9
WBC: 7.4
WBC: 5.9

## 2010-12-23 LAB — DIFFERENTIAL
Basophils Relative: 0
Eosinophils Relative: 2
Lymphocytes Relative: 18
Lymphocytes Relative: 37
Lymphs Abs: 1.3
Lymphs Abs: 2.1
Monocytes Relative: 6
Neutro Abs: 5.6
Neutrophils Relative %: 75

## 2010-12-23 LAB — URINALYSIS, ROUTINE W REFLEX MICROSCOPIC
Bilirubin Urine: NEGATIVE
Ketones, ur: NEGATIVE
Nitrite: NEGATIVE
Urobilinogen, UA: 0.2

## 2010-12-23 LAB — HEPATIC FUNCTION PANEL
ALT: 15
AST: 13
Albumin: 3.6
Total Bilirubin: 0.4

## 2010-12-23 LAB — LIPID PANEL
Cholesterol: 266 — ABNORMAL HIGH
LDL Cholesterol: 204 — ABNORMAL HIGH
Total CHOL/HDL Ratio: 6.3
Total CHOL/HDL Ratio: 7.1
VLDL: 28

## 2010-12-23 LAB — BASIC METABOLIC PANEL
BUN: 17
CO2: 24
Calcium: 9.2
Chloride: 98
Chloride: 108
Creatinine, Ser: 0.77
GFR calc Af Amer: >60
GFR calc non Af Amer: >60
Sodium: 131 — ABNORMAL LOW

## 2010-12-23 LAB — HEMOGLOBIN A1C: Mean Plasma Glucose: 485

## 2010-12-23 LAB — COMPREHENSIVE METABOLIC PANEL
AST: 21
CO2: 27
Calcium: 9.2
Creatinine, Ser: 0.81
GFR calc Af Amer: >60
GFR calc non Af Amer: >60
Total Protein: 6.8

## 2010-12-23 LAB — HOMOCYSTEINE: Homocysteine: 13.1

## 2011-01-03 ENCOUNTER — Other Ambulatory Visit: Payer: Self-pay | Admitting: Internal Medicine

## 2011-01-03 DIAGNOSIS — C73 Malignant neoplasm of thyroid gland: Secondary | ICD-10-CM

## 2011-01-04 ENCOUNTER — Ambulatory Visit
Admission: RE | Admit: 2011-01-04 | Discharge: 2011-01-04 | Disposition: A | Discharge status: Home / Self Care | Payer: PRIVATE HEALTH INSURANCE | Source: Ambulatory Visit | Attending: Internal Medicine | Admitting: Internal Medicine

## 2011-01-04 DIAGNOSIS — C73 Malignant neoplasm of thyroid gland: Secondary | ICD-10-CM

## 2011-04-13 ENCOUNTER — Other Ambulatory Visit: Payer: Self-pay | Admitting: Cardiology

## 2011-04-13 DIAGNOSIS — Z1231 Encounter for screening mammogram for malignant neoplasm of breast: Secondary | ICD-10-CM

## 2011-05-18 ENCOUNTER — Ambulatory Visit
Admission: RE | Admit: 2011-05-18 | Discharge: 2011-05-18 | Disposition: A | Discharge status: Home / Self Care | Payer: PRIVATE HEALTH INSURANCE | Source: Ambulatory Visit | Attending: Cardiology | Admitting: Cardiology

## 2011-05-18 DIAGNOSIS — Z1231 Encounter for screening mammogram for malignant neoplasm of breast: Secondary | ICD-10-CM

## 2011-07-10 ENCOUNTER — Other Ambulatory Visit: Payer: Self-pay | Admitting: Internal Medicine

## 2011-07-10 DIAGNOSIS — C73 Malignant neoplasm of thyroid gland: Secondary | ICD-10-CM

## 2011-07-19 ENCOUNTER — Ambulatory Visit
Admission: RE | Admit: 2011-07-19 | Discharge: 2011-07-19 | Disposition: A | Discharge status: Home / Self Care | Payer: PRIVATE HEALTH INSURANCE | Source: Ambulatory Visit | Attending: Internal Medicine | Admitting: Internal Medicine

## 2011-07-19 DIAGNOSIS — C73 Malignant neoplasm of thyroid gland: Secondary | ICD-10-CM

## 2011-07-31 ENCOUNTER — Other Ambulatory Visit: Payer: Self-pay | Admitting: Internal Medicine

## 2011-07-31 DIAGNOSIS — C73 Malignant neoplasm of thyroid gland: Secondary | ICD-10-CM

## 2011-08-07 ENCOUNTER — Ambulatory Visit (HOSPITAL_COMMUNITY)
Admission: RE | Admit: 2011-08-07 | Discharge: 2011-08-07 | Disposition: A | Discharge status: Home / Self Care | Payer: PRIVATE HEALTH INSURANCE | Source: Ambulatory Visit | Attending: Internal Medicine | Admitting: Internal Medicine

## 2011-08-07 ENCOUNTER — Inpatient Hospital Stay (HOSPITAL_COMMUNITY): Admission: RE | Admit: 2011-08-07 | Payer: PRIVATE HEALTH INSURANCE | Source: Ambulatory Visit

## 2011-08-07 DIAGNOSIS — C73 Malignant neoplasm of thyroid gland: Secondary | ICD-10-CM

## 2011-08-07 DIAGNOSIS — Z09 Encounter for follow-up examination after completed treatment for conditions other than malignant neoplasm: Secondary | ICD-10-CM | POA: Insufficient documentation

## 2011-08-07 MED ORDER — THYROTROPIN ALFA 1.1 MG IM SOLR
0.9000 mg | INTRAMUSCULAR | Status: AC
  Administered 2011-08-07: 0.9 mg via INTRAMUSCULAR
  Filled 2011-08-07: qty 0.9

## 2011-08-08 ENCOUNTER — Encounter (HOSPITAL_COMMUNITY)
Admission: RE | Admit: 2011-08-08 | Discharge: 2011-08-08 | Disposition: A | Discharge status: Home / Self Care | Payer: PRIVATE HEALTH INSURANCE | Source: Ambulatory Visit | Attending: Internal Medicine | Admitting: Internal Medicine

## 2011-08-08 DIAGNOSIS — C73 Malignant neoplasm of thyroid gland: Secondary | ICD-10-CM | POA: Insufficient documentation

## 2011-08-08 MED ORDER — THYROTROPIN ALFA 1.1 MG IM SOLR
0.9000 mg | INTRAMUSCULAR | Status: AC
  Administered 2011-08-08: 0.9 mg via INTRAMUSCULAR
  Filled 2011-08-08: qty 0.9

## 2011-08-09 ENCOUNTER — Encounter (HOSPITAL_COMMUNITY)
Admission: RE | Admit: 2011-08-09 | Discharge: 2011-08-09 | Disposition: A | Discharge status: Home / Self Care | Payer: PRIVATE HEALTH INSURANCE | Source: Ambulatory Visit | Attending: Internal Medicine | Admitting: Internal Medicine

## 2011-08-09 DIAGNOSIS — C73 Malignant neoplasm of thyroid gland: Secondary | ICD-10-CM | POA: Insufficient documentation

## 2011-08-11 ENCOUNTER — Ambulatory Visit (HOSPITAL_COMMUNITY)
Admission: RE | Admit: 2011-08-11 | Discharge: 2011-08-11 | Disposition: A | Discharge status: Home / Self Care | Payer: PRIVATE HEALTH INSURANCE | Source: Ambulatory Visit | Attending: Internal Medicine | Admitting: Internal Medicine

## 2011-08-11 MED ORDER — SODIUM IODIDE I 131 CAPSULE
4.0000 | Freq: Once | INTRAVENOUS | Status: AC | PRN
  Administered 2011-08-11: 4 via ORAL

## 2011-08-14 ENCOUNTER — Ambulatory Visit (HOSPITAL_COMMUNITY): Payer: PRIVATE HEALTH INSURANCE

## 2011-08-14 ENCOUNTER — Encounter: Payer: Self-pay | Admitting: Gastroenterology

## 2011-08-14 ENCOUNTER — Other Ambulatory Visit: Payer: Self-pay | Admitting: Cardiology

## 2011-08-14 DIAGNOSIS — I714 Abdominal aortic aneurysm, without rupture: Secondary | ICD-10-CM

## 2011-08-15 ENCOUNTER — Ambulatory Visit (HOSPITAL_COMMUNITY): Payer: PRIVATE HEALTH INSURANCE

## 2011-08-16 ENCOUNTER — Ambulatory Visit (HOSPITAL_COMMUNITY): Payer: PRIVATE HEALTH INSURANCE

## 2011-08-18 ENCOUNTER — Encounter (HOSPITAL_COMMUNITY): Payer: PRIVATE HEALTH INSURANCE

## 2011-08-22 ENCOUNTER — Ambulatory Visit
Admission: RE | Admit: 2011-08-22 | Discharge: 2011-08-22 | Disposition: A | Discharge status: Home / Self Care | Payer: PRIVATE HEALTH INSURANCE | Source: Ambulatory Visit | Attending: Cardiology | Admitting: Cardiology

## 2011-08-22 DIAGNOSIS — I714 Abdominal aortic aneurysm, without rupture: Secondary | ICD-10-CM

## 2011-08-30 ENCOUNTER — Ambulatory Visit (HOSPITAL_COMMUNITY): Payer: PRIVATE HEALTH INSURANCE | Attending: Cardiology

## 2011-09-08 ENCOUNTER — Ambulatory Visit (HOSPITAL_COMMUNITY)
Admission: RE | Admit: 2011-09-08 | Discharge: 2011-09-08 | Disposition: A | Discharge status: Home / Self Care | Payer: PRIVATE HEALTH INSURANCE | Source: Ambulatory Visit | Attending: Cardiology | Admitting: Cardiology

## 2011-09-08 DIAGNOSIS — R0989 Other specified symptoms and signs involving the circulatory and respiratory systems: Secondary | ICD-10-CM

## 2011-09-08 DIAGNOSIS — M79609 Pain in unspecified limb: Secondary | ICD-10-CM | POA: Insufficient documentation

## 2011-09-08 DIAGNOSIS — R52 Pain, unspecified: Secondary | ICD-10-CM

## 2011-09-19 ENCOUNTER — Ambulatory Visit (AMBULATORY_SURGERY_CENTER): Payer: PRIVATE HEALTH INSURANCE

## 2011-09-19 VITALS — Ht 62.0 in | Wt 162.2 lb

## 2011-09-19 DIAGNOSIS — Z1211 Encounter for screening for malignant neoplasm of colon: Secondary | ICD-10-CM

## 2011-09-19 MED ORDER — MOVIPREP 100 G PO SOLR: ORAL | Status: DC

## 2011-10-03 ENCOUNTER — Ambulatory Visit (AMBULATORY_SURGERY_CENTER): Payer: PRIVATE HEALTH INSURANCE | Admitting: Gastroenterology

## 2011-10-03 ENCOUNTER — Encounter: Payer: Self-pay | Admitting: Gastroenterology

## 2011-10-03 VITALS — BP 187/86 | HR 80 | Temp 96.7°F | Resp 31 | Ht 62.0 in | Wt 162.0 lb

## 2011-10-03 DIAGNOSIS — Z1211 Encounter for screening for malignant neoplasm of colon: Secondary | ICD-10-CM

## 2011-10-03 MED ORDER — SODIUM CHLORIDE 0.9 % IV SOLN: 500.0000 mL | INTRAVENOUS | Status: DC

## 2011-10-03 NOTE — Progress Notes (Signed)
Patient did not experience any of the following events: a burn prior to discharge; a fall within the facility; wrong site/side/patient/procedure/implant event; or a hospital transfer or hospital admission upon discharge from the facility. (G8907) Patient did not have preoperative order for IV antibiotic SSI prophylaxis. (G8918)  

## 2011-10-03 NOTE — Progress Notes (Signed)
The pt tolerated the colonoscopy very well. Maw   

## 2011-10-03 NOTE — Op Note (Signed)
[facility] [address] [place], [place]  [address]  COLONOSCOPY PROCEDURE REPORT  PATIENT:  [name] [name], [name] [name]  MR#:  [id number] BIRTHDATE:  21-Jul-1950, 61 yrs. old  GENDER:  female ENDOSCOPIST:  Barbette Hair. Arlyce Dice, MD REF. BY:  Donia Guiles, M.D. PROCEDURE DATE:  10/03/2011 PROCEDURE:  Diagnostic Colonoscopy ASA CLASS:  Class II INDICATIONS:  Routine Risk Screening MEDICATIONS:   MAC sedation, administered by CRNA propofol 190mg IV  DESCRIPTION OF PROCEDURE:   After the risks benefits and alternatives of the procedure were thoroughly explained, informed consent was obtained.  Digital rectal exam was performed and revealed no abnormalities.   The LB CF-[id number] endoscope was introduced through the anus and advanced to the cecum, which was identified by both the appendix and ileocecal valve, without limitations.  The quality of the prep was good, using MiraLax. The instrument was then slowly withdrawn as the colon was fully examined. <<PROCEDUREIMAGES>>  FINDINGS:  A normal appearing cecum, ileocecal valve, and appendiceal orifice were identified. The ascending, hepatic flexure, transverse, splenic flexure, descending, sigmoid colon, and rectum appeared unremarkable (see image1 and image2). Retroflexed views in the rectum revealed no abnormalities.    The time to cecum =  1) 4.50  minutes. The scope was then withdrawn in 1) 6.25  minutes from the cecum and the procedure completed. COMPLICATIONS:  None ENDOSCOPIC IMPRESSION: 1) Normal colon RECOMMENDATIONS: 1) Continue current colorectal screening recommendations for "routine risk" patients with a repeat colonoscopy in 10 years. 2) resume plavix today REPEAT EXAM:  In 10 year(s) for Colonoscopy.  ______________________________ Barbette Hair. Arlyce Dice, MD  CC:  n. eSIGNED:   Barbette Hair. Ernst Cumpston at 10/03/2011 11:59 AM  Alma Downs, [phone]

## 2011-10-03 NOTE — Patient Instructions (Addendum)
Resume plavix todayYOU HAD AN ENDOSCOPIC PROCEDURE TODAY AT THE [facility]: Refer to the procedure report that was given to you for any specific questions about what was found during the examination.  If the procedure report does not answer your questions, please call your gastroenterologist to clarify.  If you requested that your care partner not be given the details of your procedure findings, then the procedure report has been included in a sealed envelope for you to review at your convenience later.  YOU SHOULD EXPECT: Some feelings of bloating in the abdomen. Passage of more gas than usual.  Walking can help get rid of the air that was put into your GI tract during the procedure and reduce the bloating. If you had a lower endoscopy (such as a colonoscopy or flexible sigmoidoscopy) you may notice spotting of blood in your stool or on the toilet paper. If you underwent a bowel prep for your procedure, then you may not have a normal bowel movement for a few days.  DIET: Your first meal following the procedure should be a light meal and then it is ok to progress to your normal diet.  A half-sandwich or bowl of soup is an example of a good first meal.  Heavy or fried foods are harder to digest and may make you feel nauseous or bloated.  Likewise meals heavy in dairy and vegetables can cause extra gas to form and this can also increase the bloating.  Drink plenty of fluids but you should avoid alcoholic beverages for 24 hours.  ACTIVITY: Your care partner should take you home directly after the procedure.  You should plan to take it easy, moving slowly for the rest of the day.  You can resume normal activity the day after the procedure however you should NOT DRIVE or use heavy machinery for 24 hours (because of the sedation medicines used during the test).    SYMPTOMS TO REPORT IMMEDIATELY: A gastroenterologist can be reached at any hour.  During normal business hours, 8:30 AM to 5:00 PM Monday  through Friday, call [phone].  After hours and on weekends, please call the GI answering service at [phone] who will take a message and have the physician on call contact you.   Following lower endoscopy (colonoscopy or flexible sigmoidoscopy):  Excessive amounts of blood in the stool  Significant tenderness or worsening of abdominal pains  Swelling of the abdomen that is new, acute  Fever of 100F or higher  FOLLOW UP: If any biopsies were taken you will be contacted by phone or by letter within the next 1-3 weeks.  Call your gastroenterologist if you have not heard about the biopsies in 3 weeks.  Our staff will call the home number listed on your records the next business day following your procedure to check on you and address any questions or concerns that you may have at that time regarding the information given to you following your procedure. This is a courtesy call and so if there is no answer at the home number and we have not heard from you through the emergency physician on call, we will assume that you have returned to your regular daily activities without incident.  SIGNATURES/CONFIDENTIALITY: You and/or your care partner have signed paperwork which will be entered into your electronic medical record.  These signatures attest to the fact that that the information above on your After Visit Summary has been reviewed and is understood.  Full responsibility of the confidentiality   of this discharge information lies with you and/or your care-partner.  

## 2011-10-04 ENCOUNTER — Telehealth: Payer: Self-pay

## 2011-10-04 NOTE — Telephone Encounter (Signed)
  Follow up Call-  Call back number 10/03/2011  Post procedure Call Back phone  # [phone]  Permission to leave phone message Yes     Patient questions:  Do you have a fever, pain , or abdominal swelling? no Pain Score  0 *  Have you tolerated food without any problems? yes  Have you been able to return to your normal activities? yes  Do you have any questions about your discharge instructions: Diet   no Medications  no Follow up visit  no  Do you have questions or concerns about your Care? no  Actions: * If pain score is 4 or above: No action needed, pain <4.  Per the pt, "I was so glad everything turned out the way it did". Maw

## 2012-01-08 ENCOUNTER — Other Ambulatory Visit: Payer: Self-pay | Admitting: Internal Medicine

## 2012-01-08 DIAGNOSIS — C73 Malignant neoplasm of thyroid gland: Secondary | ICD-10-CM

## 2012-01-10 ENCOUNTER — Ambulatory Visit
Admission: RE | Admit: 2012-01-10 | Discharge: 2012-01-10 | Disposition: A | Discharge status: Home / Self Care | Payer: PRIVATE HEALTH INSURANCE | Source: Ambulatory Visit | Attending: Internal Medicine | Admitting: Internal Medicine

## 2012-01-10 DIAGNOSIS — C73 Malignant neoplasm of thyroid gland: Secondary | ICD-10-CM

## 2012-01-15 ENCOUNTER — Other Ambulatory Visit: Payer: Self-pay | Admitting: Internal Medicine

## 2012-01-15 DIAGNOSIS — C73 Malignant neoplasm of thyroid gland: Secondary | ICD-10-CM

## 2012-01-18 ENCOUNTER — Other Ambulatory Visit: Payer: PRIVATE HEALTH INSURANCE

## 2012-01-22 ENCOUNTER — Other Ambulatory Visit: Payer: PRIVATE HEALTH INSURANCE

## 2012-02-01 ENCOUNTER — Ambulatory Visit
Admission: RE | Admit: 2012-02-01 | Discharge: 2012-02-01 | Disposition: A | Discharge status: Home / Self Care | Payer: PRIVATE HEALTH INSURANCE | Source: Ambulatory Visit | Attending: Internal Medicine | Admitting: Internal Medicine

## 2012-02-01 DIAGNOSIS — C73 Malignant neoplasm of thyroid gland: Secondary | ICD-10-CM

## 2012-02-01 MED ORDER — GADOBENATE DIMEGLUMINE 529 MG/ML IV SOLN
15.0000 mL | Freq: Once | INTRAVENOUS | Status: AC | PRN
  Administered 2012-02-01: 15 mL via INTRAVENOUS

## 2012-02-06 ENCOUNTER — Other Ambulatory Visit: Payer: Self-pay | Admitting: Internal Medicine

## 2012-02-06 DIAGNOSIS — R221 Localized swelling, mass and lump, neck: Secondary | ICD-10-CM

## 2012-02-12 ENCOUNTER — Encounter (HOSPITAL_COMMUNITY): Payer: Self-pay | Admitting: Pharmacist

## 2012-02-13 ENCOUNTER — Other Ambulatory Visit: Payer: Self-pay | Admitting: Radiology

## 2012-02-14 ENCOUNTER — Ambulatory Visit (HOSPITAL_COMMUNITY)
Admission: RE | Admit: 2012-02-14 | Discharge: 2012-02-14 | Disposition: A | Discharge status: Home / Self Care | Payer: PRIVATE HEALTH INSURANCE | Source: Ambulatory Visit | Attending: Internal Medicine | Admitting: Internal Medicine

## 2012-02-14 ENCOUNTER — Encounter (HOSPITAL_COMMUNITY): Payer: Self-pay

## 2012-02-14 DIAGNOSIS — R220 Localized swelling, mass and lump, head: Secondary | ICD-10-CM | POA: Insufficient documentation

## 2012-02-14 DIAGNOSIS — E119 Type 2 diabetes mellitus without complications: Secondary | ICD-10-CM | POA: Insufficient documentation

## 2012-02-14 DIAGNOSIS — R221 Localized swelling, mass and lump, neck: Secondary | ICD-10-CM

## 2012-02-14 DIAGNOSIS — C73 Malignant neoplasm of thyroid gland: Secondary | ICD-10-CM | POA: Insufficient documentation

## 2012-02-14 DIAGNOSIS — Z8673 Personal history of transient ischemic attack (TIA), and cerebral infarction without residual deficits: Secondary | ICD-10-CM | POA: Insufficient documentation

## 2012-02-14 DIAGNOSIS — C801 Malignant (primary) neoplasm, unspecified: Secondary | ICD-10-CM | POA: Insufficient documentation

## 2012-02-14 DIAGNOSIS — E785 Hyperlipidemia, unspecified: Secondary | ICD-10-CM | POA: Insufficient documentation

## 2012-02-14 DIAGNOSIS — M6250 Muscle wasting and atrophy, not elsewhere classified, unspecified site: Secondary | ICD-10-CM | POA: Insufficient documentation

## 2012-02-14 DIAGNOSIS — Z87891 Personal history of nicotine dependence: Secondary | ICD-10-CM | POA: Insufficient documentation

## 2012-02-14 DIAGNOSIS — I10 Essential (primary) hypertension: Secondary | ICD-10-CM | POA: Insufficient documentation

## 2012-02-14 LAB — PROTIME-INR
INR: 0.92 (ref 0.00–1.49)
Prothrombin Time: 12.3 seconds (ref 11.6–15.2)

## 2012-02-14 LAB — CBC
Platelets: 269 10*3/uL (ref 150–400)
RBC: 4.48 MIL/uL (ref 3.87–5.11)
RDW: 13.6 % (ref 11.5–15.5)
WBC: 3.7 10*3/uL — ABNORMAL LOW (ref 4.0–10.5)

## 2012-02-14 LAB — GLUCOSE, CAPILLARY: Glucose-Capillary: 96 mg/dL (ref 70–99)

## 2012-02-14 LAB — APTT: aPTT: 30 seconds (ref 24–37)

## 2012-02-14 MED ORDER — MIDAZOLAM HCL 5 MG/5ML IJ SOLN
INTRAMUSCULAR | Status: AC | PRN
  Administered 2012-02-14: 1 mg via INTRAVENOUS

## 2012-02-14 MED ORDER — MIDAZOLAM HCL 2 MG/2ML IJ SOLN
INTRAMUSCULAR | Status: AC | PRN
  Administered 2012-02-14: 1 mg via INTRAVENOUS

## 2012-02-14 MED ORDER — FENTANYL CITRATE 0.05 MG/ML IJ SOLN
INTRAMUSCULAR | Status: AC | PRN
  Administered 2012-02-14 (×2): 50 ug via INTRAVENOUS

## 2012-02-14 MED ORDER — SODIUM CHLORIDE 0.9 % IV SOLN
INTRAVENOUS | Status: DC
  Administered 2012-02-14: 12:00:00 via INTRAVENOUS

## 2012-02-14 MED ORDER — FENTANYL CITRATE 0.05 MG/ML IJ SOLN
INTRAMUSCULAR | Status: AC
  Filled 2012-02-14: qty 4

## 2012-02-14 MED ORDER — MIDAZOLAM HCL 2 MG/2ML IJ SOLN
INTRAMUSCULAR | Status: AC
  Filled 2012-02-14: qty 4

## 2012-02-14 NOTE — H&P (Signed)
[name] [name] is an 61 y.o. female.   Chief Complaint: left neck mass HPI: Patient with history of metastatic papillary thyroid carcinoma, s/p thyroidectomy/left neck node dissection, who presents today for US guided biopsy of a recently noted 2.4 cm mass in left posterior triangle region of neck.  Past Medical History  Diagnosis Date  . Stroke 2009  . Thyroid disease   . Diabetes mellitus   . Hypertension   . Hyperlipidemia   . Cataract     Past Surgical History  Procedure Date  . Thyroidectomy 2011 and 2012  . Total abdominal hysterectomy   . Carpal tunnel release     Bilateral    Family History  Problem Relation Age of Onset  . Diabetes Mother    Social History:  reports that she quit smoking about 28 years ago. Her smoking use included Cigarettes. She has never used smokeless tobacco. She reports that she does not drink alcohol or use illicit drugs.  Allergies:  Allergies  Allergen Reactions  . Demerol (Meperidine) Hives    Current outpatient prescriptions:amLODipine-valsartan (EXFORGE) 10-320 MG per tablet, Take 1 tablet by mouth daily., Disp: , Rfl: ;  aspirin 325 MG tablet, Take 325 mg by mouth daily., Disp: , Rfl: ;  clopidogrel (PLAVIX) 75 MG tablet, Take 75 mg by mouth daily., Disp: , Rfl: ;  levothyroxine (SYNTHROID) 150 MCG tablet, Take 150 mcg by mouth daily before breakfast. , Disp: , Rfl:  lovastatin (MEVACOR) 40 MG tablet, Take 40 mg by mouth daily. , Disp: , Rfl: ;  metFORMIN (GLUCOPHAGE) 1000 MG tablet, Take 1,000 mg by mouth 2 (two) times daily with a meal., Disp: , Rfl: ;  metoprolol succinate (TOPROL-XL) 50 MG 24 hr tablet, Take 50 mg by mouth daily. Take with or immediately following a meal., Disp: , Rfl: ;  niacin (NIASPAN) 500 MG CR tablet, Take 500 mg by mouth at bedtime., Disp: , Rfl:  traMADol (ULTRAM) 50 MG tablet, Take 50 mg by mouth every 6 (six) hours as needed. Pain, Disp: , Rfl:  Current facility-administered medications:0.9 %  sodium chloride  infusion, , Intravenous, Continuous, Brayton El, PA, Last Rate: 20 mL/hr at 02/14/12 1200   Results for orders placed during the hospital encounter of 02/14/12 (from the past 48 hour(s))  APTT     Status: Normal   Collection Time   02/14/12 12:00 PM      Component Value Range Comment   aPTT 30  24 - 37 seconds   CBC     Status: Abnormal   Collection Time   02/14/12 12:00 PM      Component Value Range Comment   WBC 3.7 (*) 4.0 - 10.5 K/uL    RBC 4.48  3.87 - 5.11 MIL/uL    Hemoglobin 12.0  12.0 - 15.0 g/dL    HCT 16.1 (*) 09.6 - 46.0 %    MCV 78.6  78.0 - 100.0 fL    MCH 26.8  26.0 - 34.0 pg    MCHC 34.1  30.0 - 36.0 g/dL    RDW 04.5  40.9 - 81.1 %    Platelets 269  150 - 400 K/uL   PROTIME-INR     Status: Normal   Collection Time   02/14/12 12:00 PM      Component Value Range Comment   Prothrombin Time 12.3  11.6 - 15.2 seconds    INR 0.92  0.00 - 1.49    No results found.  Review of Systems  Constitutional: Negative for fever and chills.  HENT: Positive for neck pain.        Pain around left ear/posterior neck region  Respiratory: Negative for cough and shortness of breath.   Cardiovascular: Negative for chest pain.  Gastrointestinal: Negative for nausea, vomiting and abdominal pain.  Musculoskeletal: Positive for joint pain. Negative for back pain.  Neurological: Positive for headaches.  Endo/Heme/Allergies: Does not bruise/bleed easily.    Blood pressure 137/62, pulse 70, temperature 97.7 F (36.5 C), temperature source Oral, resp. rate 18, height 5\' 2"  (1.575 m), weight 162 lb (73.483 kg). Physical Exam  Constitutional: She is oriented to person, place, and time. She appears well-developed and well-nourished.  Cardiovascular: Normal rate and regular rhythm.   Respiratory: Effort normal and breath sounds normal.  GI: Soft. Bowel sounds are normal. There is no tenderness.  Musculoskeletal: She exhibits no edema.  Neurological: She is alert and oriented to  person, place, and time.     Assessment/Plan Pt with history of metastatic papillary thyroid carcinoma and recent imaging showing a 2.4 cm left posterior triangle neck mass. Plan is for US guided biopsy of the left neck mass. Details/risks of procedure d/w pt/daughter with their understanding and consent.  ALLRED,D KEVIN 02/14/2012, 12:43 PM

## 2012-02-14 NOTE — Procedures (Signed)
Technically successful US guided biopsy of superior posterior left sided indeterminate intramuscular mass.  No immediate complications.

## 2012-03-22 ENCOUNTER — Encounter (HOSPITAL_COMMUNITY): Payer: Self-pay | Admitting: Pharmacy Technician

## 2012-03-28 ENCOUNTER — Other Ambulatory Visit: Payer: Self-pay | Admitting: Otolaryngology

## 2012-03-28 NOTE — H&P (Signed)
[name] [name], [name] [name] 62 y.o., female [id number]     Chief Complaint: LEFT neck mass  HPI: Preop visit before removal of a LEFT upper neck mass.  Although the history would be sufficient for thyroid cancer, thyroglobulin has remained low, and a needle aspiration in this area showed only fibrosis.  This may be a neuroma.  I do think resecting this for complete pathologic certainty is appropriate.   I discussed the surgery including risks and complications.  Questions were answered and informed consent was obtained.  Advancement of diet and activity including wound care were discussed.  Prescriptions given for Vicodin and Percocet.  3 months recheck.  Since we saw her last, an MRI scan of the neck showed a fairly discrete enhancing mass just inferior to the mastoid tip and basically in between the sternocleidomastoid and levator scapulae muscles.  It is 2.4 cm in greatest extent.  No other adenopathy noted.  Fine-needle aspiration showed skeletal muscle with dense fibrosis.   She is basically not aware of the mass except as people have been pointing it out and manipulating it.  She does not think it is growing.  No discomfort.  PMH: Past Medical History  Diagnosis Date  . Stroke 2009  . Thyroid disease   . Diabetes mellitus   . Hypertension   . Hyperlipidemia   . Cataract     Surg Hx: Past Surgical History  Procedure Date  . Thyroidectomy 2011 and 2012  . Total abdominal hysterectomy   . Carpal tunnel release     Bilateral    FHx:   Family History  Problem Relation Age of Onset  . Diabetes Mother    SocHx:  reports that she quit smoking about 28 years ago. Her smoking use included Cigarettes. She has never used smokeless tobacco. She reports that she does not drink alcohol or use illicit drugs.  ALLERGIES:  Allergies  Allergen Reactions  . Demerol (Meperidine) Hives     (Not in a hospital admission)  No results found for this or any previous visit (from the past 48  hour(s)). No results found.  ROS: occ hoarseness.  Weak sore LEFT shoulder.  There were no vitals taken for this visit.  PHYSICAL EXAM: She is energetic.  Her voice is slightly scratchy but phonatory.  She is breathing comfortably through her nose.  There is a firm but not hard mass in the upper sternocleidomastoid muscle on the LEFT side.  No other masses identified.   Lungs: Clear to auscultation Heart: Regular rate and rhythm without murmurs Abdomen: Soft and active Extremities: Normal configuration Neurologic: Symmetric and intact.  Studies Reviewed:  MRI neck    Assessment/Plan  Neck mass (784.2) (R22.1).  We are going to remove the LEFT neck mass next week.  I think there is  a fair chance that this is NOT thyroid cancer, and may just be leftover scar tissue from your previous surgeries.  Stop your aspirin and Plavix now.  Buy a small bottle of Hibiclens (Chlorhexidine generic) soap and shower with it the night before surgery and the morning of surgery.  Plan on staying in the hospital overnight one night.  I am giving you prescriptions for Vicodin for moderate pain relief, and for Percocet in case you need something stronger.    Recheck here 10 days post op.  Limited activities for 2 weeks.  Hydrocodone-Acetaminophen 5-325 MG Oral Tablet;TAKE 1 TO 2 TABLETS EVERY 4 TO 6 HOURS AS NEEDED FOR PAIN; Qty30; R2; Rx. Oxycodone-Acetaminophen  5-325 MG Oral Tablet;TAKE 1 TO 2 TABLETS EVERY 4 TO 6 HOURS AS NEEDED FOR PAIN; Qty20; R0; Rx.  Lazarus Salines, Jayveion Stalling 03/28/2012, 10:20 AM

## 2012-03-29 ENCOUNTER — Encounter (HOSPITAL_COMMUNITY)
Admission: RE | Admit: 2012-03-29 | Discharge: 2012-03-29 | Disposition: A | Discharge status: Home / Self Care | Payer: PRIVATE HEALTH INSURANCE | Source: Ambulatory Visit | Attending: Otolaryngology | Admitting: Otolaryngology

## 2012-03-29 ENCOUNTER — Encounter (HOSPITAL_COMMUNITY)
Admission: RE | Admit: 2012-03-29 | Discharge: 2012-03-29 | Disposition: A | Discharge status: Home / Self Care | Payer: PRIVATE HEALTH INSURANCE | Source: Ambulatory Visit | Attending: Anesthesiology | Admitting: Anesthesiology

## 2012-03-29 ENCOUNTER — Encounter (HOSPITAL_COMMUNITY): Payer: Self-pay

## 2012-03-29 HISTORY — DX: Malignant neoplasm of thyroid gland: C73

## 2012-03-29 HISTORY — DX: Unspecified osteoarthritis, unspecified site: M19.90

## 2012-03-29 LAB — CBC
HCT: 36.9 % (ref 36.0–46.0)
Hemoglobin: 12.5 g/dL (ref 12.0–15.0)
MCV: 79 fL (ref 78.0–100.0)
RBC: 4.67 MIL/uL (ref 3.87–5.11)
WBC: 6.3 10*3/uL (ref 4.0–10.5)

## 2012-03-29 LAB — BASIC METABOLIC PANEL
CO2: 26 mEq/L (ref 19–32)
Chloride: 96 mEq/L (ref 96–112)
Potassium: 4.1 mEq/L (ref 3.5–5.1)
Sodium: 132 mEq/L — ABNORMAL LOW (ref 135–145)

## 2012-03-29 LAB — SURGICAL PCR SCREEN: Staphylococcus aureus: NEGATIVE

## 2012-03-29 NOTE — Progress Notes (Signed)
Requested most recent ov, EKG, stress (if ava.) and echo (if ava.) from Dr. Shana Chute

## 2012-03-29 NOTE — Pre-Procedure Instructions (Signed)
20 [address]TA MELLIN  03/29/2012   Your procedure is scheduled on:  Wednesday January 8th 2014  Report to [facility] on the third floor   at 0630 AM.  Call this number if you have problems the morning of surgery: [phone]   Remember:   Do not eat food  Do not drink liquids  :After Midnight.    Take these medicines the morning of surgery with A SIP OF WATER: Synthroid  Toprol   Do not wear jewelry, make-up or nail polish.  Do not wear lotions, powders, or perfumes. You may wear deodorant.  Do not shave 48 hours prior to surgery. Men may shave face and neck.  Do not bring valuables to the hospital.  Contacts, dentures or bridgework may not be worn into surgery.  Leave suitcase in the car. After surgery it may be brought to your room.  For patients admitted to the hospital, checkout time is 11:00 AM the day of discharge.   Patients discharged the day of surgery will not be allowed to drive home.  Name and phone number of your driver:   Special Instructions: Shower using CHG 2 nights before surgery and the night before surgery.  If you shower the day of surgery use CHG.  Use special wash - you have one bottle of CHG for all showers.  You should use approximately 1/3 of the bottle for each shower.   Please read over the following fact sheets that you were given: Pain Booklet, Coughing and Deep Breathing, Lab Information, MRSA Information and Surgical Site Infection Prevention

## 2012-04-01 NOTE — Consult Note (Addendum)
Anesthesia chart review: Patient is a 62 year old female scheduled for excision of left neck mass, frozen section, possible selective neck dissection by Dr. Lazarus Salines on 04/03/2012. History includes former smoker, hyperlipidemia, hypertension, CVA in 2009, diabetes mellitus type 2, metastatic thyroid cancer status post thyroidectomy, prior left neck dissection 07/04/10, hysterectomy, cataracts, arthritis.  PCP is listed as Dr. Donia Guiles, records requested but are still pending.  EKG on 03/29/12 showed normal sinus rhythm, right BBB.  It was not felt significantly changed since 11/07/09.  Nuclear stress test on 06/30/05 showed: Myocardial perfusion imaging is normal. Overall left ventricular systolic function is normal with a left ventricular ejection fraction equal to 72 percent.  Chest x-ray on 03/30/2011 showed low lung volumes, no active disease.  Preoperative labs noted.  I've asked Short Stay staff to follow-up on records requested from Dr. Shana Chute and have me review if any additional studies sent.  Otherwise, based on current records I think she could proceed as planned if no significant change in her status.  (Update: no recent stress or echo at Dr. Magda Kiel office.  Last visit 03/06/12.)  Shonna Chock, [facility] 04/01/12 0957

## 2012-04-02 MED ORDER — CEFAZOLIN SODIUM-DEXTROSE 2-3 GM-% IV SOLR
2.0000 g | INTRAVENOUS | Status: AC
  Administered 2012-04-03: 2 g via INTRAVENOUS
  Filled 2012-04-02: qty 50

## 2012-04-03 ENCOUNTER — Encounter (HOSPITAL_COMMUNITY): Payer: Self-pay | Admitting: *Deleted

## 2012-04-03 ENCOUNTER — Encounter (HOSPITAL_COMMUNITY): Payer: Self-pay | Admitting: Vascular Surgery

## 2012-04-03 ENCOUNTER — Ambulatory Visit (HOSPITAL_COMMUNITY)
Admission: RE | Admit: 2012-04-03 | Discharge: 2012-04-03 | Disposition: A | Discharge status: Home / Self Care | Payer: PRIVATE HEALTH INSURANCE | Source: Ambulatory Visit | Attending: Otolaryngology | Admitting: Otolaryngology

## 2012-04-03 ENCOUNTER — Ambulatory Visit (HOSPITAL_COMMUNITY): Payer: PRIVATE HEALTH INSURANCE | Admitting: Vascular Surgery

## 2012-04-03 ENCOUNTER — Encounter (HOSPITAL_COMMUNITY)
Admission: RE | Disposition: A | Discharge status: Home / Self Care | Payer: Self-pay | Source: Ambulatory Visit | Attending: Otolaryngology

## 2012-04-03 DIAGNOSIS — E785 Hyperlipidemia, unspecified: Secondary | ICD-10-CM | POA: Insufficient documentation

## 2012-04-03 DIAGNOSIS — R221 Localized swelling, mass and lump, neck: Secondary | ICD-10-CM

## 2012-04-03 DIAGNOSIS — Z01810 Encounter for preprocedural cardiovascular examination: Secondary | ICD-10-CM | POA: Insufficient documentation

## 2012-04-03 DIAGNOSIS — Z8673 Personal history of transient ischemic attack (TIA), and cerebral infarction without residual deficits: Secondary | ICD-10-CM | POA: Insufficient documentation

## 2012-04-03 DIAGNOSIS — I10 Essential (primary) hypertension: Secondary | ICD-10-CM | POA: Insufficient documentation

## 2012-04-03 DIAGNOSIS — Z01812 Encounter for preprocedural laboratory examination: Secondary | ICD-10-CM | POA: Insufficient documentation

## 2012-04-03 DIAGNOSIS — M729 Fibroblastic disorder, unspecified: Secondary | ICD-10-CM | POA: Insufficient documentation

## 2012-04-03 DIAGNOSIS — M6289 Other specified disorders of muscle: Secondary | ICD-10-CM | POA: Insufficient documentation

## 2012-04-03 DIAGNOSIS — E119 Type 2 diabetes mellitus without complications: Secondary | ICD-10-CM | POA: Insufficient documentation

## 2012-04-03 HISTORY — PX: MASS EXCISION: SHX2000

## 2012-04-03 LAB — GLUCOSE, CAPILLARY: Glucose-Capillary: 109 mg/dL — ABNORMAL HIGH (ref 70–99)

## 2012-04-03 SURGERY — EXCISION MASS
Anesthesia: General | Site: Neck | Laterality: Left | Wound class: Clean

## 2012-04-03 MED ORDER — PROPOFOL 10 MG/ML IV BOLUS
INTRAVENOUS | Status: DC | PRN
  Administered 2012-04-03: 30 mg via INTRAVENOUS
  Administered 2012-04-03: 100 mg via INTRAVENOUS

## 2012-04-03 MED ORDER — PROMETHAZINE HCL 25 MG/ML IJ SOLN: 6.2500 mg | INTRAMUSCULAR | Status: DC | PRN

## 2012-04-03 MED ORDER — DEXTROSE 5 % IV SOLN
INTRAVENOUS | Status: DC | PRN
  Administered 2012-04-03: 09:00:00 via INTRAVENOUS

## 2012-04-03 MED ORDER — FENTANYL CITRATE 0.05 MG/ML IJ SOLN
INTRAMUSCULAR | Status: DC | PRN
  Administered 2012-04-03: 25 ug via INTRAVENOUS
  Administered 2012-04-03: 100 ug via INTRAVENOUS
  Administered 2012-04-03: 125 ug via INTRAVENOUS

## 2012-04-03 MED ORDER — BACITRACIN ZINC 500 UNIT/GM EX OINT
TOPICAL_OINTMENT | CUTANEOUS | Status: DC | PRN
  Administered 2012-04-03: 1 via TOPICAL

## 2012-04-03 MED ORDER — EPHEDRINE SULFATE 50 MG/ML IJ SOLN
INTRAMUSCULAR | Status: DC | PRN
  Administered 2012-04-03 (×2): 5 mg via INTRAVENOUS

## 2012-04-03 MED ORDER — OXYCODONE HCL 5 MG/5ML PO SOLN: 5.0000 mg | Freq: Once | ORAL | Status: DC | PRN

## 2012-04-03 MED ORDER — HYDROMORPHONE HCL PF 1 MG/ML IJ SOLN: 0.2500 mg | INTRAMUSCULAR | Status: DC | PRN

## 2012-04-03 MED ORDER — ONDANSETRON HCL 4 MG/2ML IJ SOLN
INTRAMUSCULAR | Status: DC | PRN
  Administered 2012-04-03: 4 mg via INTRAVENOUS

## 2012-04-03 MED ORDER — SUCCINYLCHOLINE CHLORIDE 20 MG/ML IJ SOLN
INTRAMUSCULAR | Status: DC | PRN
  Administered 2012-04-03: 100 mg via INTRAVENOUS

## 2012-04-03 MED ORDER — MIDAZOLAM HCL 5 MG/5ML IJ SOLN
INTRAMUSCULAR | Status: DC | PRN
  Administered 2012-04-03: 0.5 mg via INTRAVENOUS

## 2012-04-03 MED ORDER — 0.9 % SODIUM CHLORIDE (POUR BTL) OPTIME
TOPICAL | Status: DC | PRN
  Administered 2012-04-03: 1000 mL

## 2012-04-03 MED ORDER — OXYCODONE HCL 5 MG PO TABS: 5.0000 mg | ORAL_TABLET | Freq: Once | ORAL | Status: DC | PRN

## 2012-04-03 MED ORDER — LIDOCAINE HCL (CARDIAC) 20 MG/ML IV SOLN
INTRAVENOUS | Status: DC | PRN
  Administered 2012-04-03: 80 mg via INTRAVENOUS

## 2012-04-03 MED ORDER — LACTATED RINGERS IV SOLN
INTRAVENOUS | Status: DC | PRN
  Administered 2012-04-03 (×2): via INTRAVENOUS

## 2012-04-03 MED ORDER — BACITRACIN ZINC 500 UNIT/GM EX OINT
TOPICAL_OINTMENT | CUTANEOUS | Status: AC
  Filled 2012-04-03: qty 30

## 2012-04-03 SURGICAL SUPPLY — 59 items
APPLIER CLIP 9.375 SM OPEN (CLIP) ×2
APR CLP SM 9.3 20 MLT OPN (CLIP) ×1
ATTRACTOMAT 16X20 MAGNETIC DRP (DRAPES) ×1 IMPLANT
BLADE SURG 15 STRL LF DISP TIS (BLADE) IMPLANT
BLADE SURG 15 STRL SS (BLADE) ×2
CANISTER SUCTION 2500CC (MISCELLANEOUS) ×2 IMPLANT
CLEANER TIP ELECTROSURG 2X2 (MISCELLANEOUS) ×2 IMPLANT
CLIP APPLIE 9.375 SM OPEN (CLIP) ×1 IMPLANT
CLOTH BEACON ORANGE TIMEOUT ST (SAFETY) ×2 IMPLANT
CONT SPEC 4OZ CLIKSEAL STRL BL (MISCELLANEOUS) ×2 IMPLANT
CONT SPECI 4OZ STER CLIK (MISCELLANEOUS) ×1 IMPLANT
CORDS BIPOLAR (ELECTRODE) ×1 IMPLANT
COVER SURGICAL LIGHT HANDLE (MISCELLANEOUS) ×2 IMPLANT
CRADLE DONUT ADULT HEAD (MISCELLANEOUS) IMPLANT
DRAIN CHANNEL 10F 3/8 F FF (DRAIN) ×1 IMPLANT
DRAIN CHANNEL 15F RND FF W/TCR (WOUND CARE) IMPLANT
DRAIN PENROSE 1/2X12 LTX STRL (WOUND CARE) IMPLANT
DRAIN PENROSE 1/4X12 LTX STRL (WOUND CARE) ×1 IMPLANT
DRSG MEPILEX BORDER 4X8 (GAUZE/BANDAGES/DRESSINGS) ×1 IMPLANT
ELECT COATED BLADE 2.86 ST (ELECTRODE) ×2 IMPLANT
ELECT REM PT RETURN 9FT ADLT (ELECTROSURGICAL) ×2
ELECTRODE REM PT RTRN 9FT ADLT (ELECTROSURGICAL) ×1 IMPLANT
EVACUATOR SILICONE 100CC (DRAIN) ×1 IMPLANT
GAUZE SPONGE 4X4 16PLY XRAY LF (GAUZE/BANDAGES/DRESSINGS) ×4 IMPLANT
GLOVE BIOGEL M STER SZ 6 (GLOVE) ×1 IMPLANT
GLOVE BIOGEL PI IND STRL 6.5 (GLOVE) IMPLANT
GLOVE BIOGEL PI IND STRL 8 (GLOVE) IMPLANT
GLOVE BIOGEL PI INDICATOR 6.5 (GLOVE) ×2
GLOVE BIOGEL PI INDICATOR 8 (GLOVE) ×1
GLOVE ECLIPSE 8.0 STRL XLNG CF (GLOVE) ×3 IMPLANT
GLOVE SURG SS PI 7.5 STRL IVOR (GLOVE) ×2 IMPLANT
GOWN STRL NON-REIN LRG LVL3 (GOWN DISPOSABLE) ×4 IMPLANT
GOWN STRL REIN XL XLG (GOWN DISPOSABLE) ×2 IMPLANT
KIT BASIN OR (CUSTOM PROCEDURE TRAY) ×2 IMPLANT
KIT ROOM TURNOVER OR (KITS) ×2 IMPLANT
LOCATOR NERVE 3 VOLT (DISPOSABLE) ×1 IMPLANT
NS IRRIG 1000ML POUR BTL (IV SOLUTION) ×2 IMPLANT
PAD ARMBOARD 7.5X6 YLW CONV (MISCELLANEOUS) ×4 IMPLANT
PENCIL BUTTON HOLSTER BLD 10FT (ELECTRODE) ×2 IMPLANT
SPECIMEN JAR MEDIUM (MISCELLANEOUS) ×1 IMPLANT
SPONGE GAUZE 4X4 12PLY (GAUZE/BANDAGES/DRESSINGS) ×1 IMPLANT
SPONGE INTESTINAL PEANUT (DISPOSABLE) ×1 IMPLANT
SPONGE LAP 18X18 X RAY DECT (DISPOSABLE) ×2 IMPLANT
STAPLER VISISTAT 35W (STAPLE) ×2 IMPLANT
SUT CHROMIC 4 0 PS 2 18 (SUTURE) ×4 IMPLANT
SUT CHROMIC 5 0 P 3 (SUTURE) IMPLANT
SUT ETHILON 3 0 PS 1 (SUTURE) ×2 IMPLANT
SUT ETHILON 5 0 PS 2 18 (SUTURE) ×3 IMPLANT
SUT SILK 2 0 REEL (SUTURE) ×1 IMPLANT
SUT SILK 2 0 SH CR/8 (SUTURE) ×2 IMPLANT
SUT SILK 3 0 REEL (SUTURE) ×2 IMPLANT
SUT SILK 4 0 REEL (SUTURE) ×2 IMPLANT
TAPE CLOTH SURG 4X10 WHT LF (GAUZE/BANDAGES/DRESSINGS) ×1 IMPLANT
TOWEL OR 17X24 6PK STRL BLUE (TOWEL DISPOSABLE) ×2 IMPLANT
TOWEL OR 17X26 10 PK STRL BLUE (TOWEL DISPOSABLE) ×2 IMPLANT
TRAY ENT MC OR (CUSTOM PROCEDURE TRAY) ×2 IMPLANT
TRAY FOLEY CATH 14FRSI W/METER (CATHETERS) IMPLANT
TUBE FEEDING 10FR FLEXIFLO (MISCELLANEOUS) IMPLANT
WATER STERILE IRR 1000ML POUR (IV SOLUTION) ×2 IMPLANT

## 2012-04-03 NOTE — Preoperative (Signed)
Beta Blockers   Reason not to administer Beta Blockers:Metoprolol 0530 

## 2012-04-03 NOTE — Transfer of Care (Signed)
Immediate Anesthesia Transfer of Care Note  Patient: [name] [name]  Procedure(s) Performed: Procedure(s) (LRB) with comments: EXCISION MASS (Left) - EXCISION LEFT NECK MASS   Patient Location: PACU  Anesthesia Type:General  Level of Consciousness: awake, sedated and patient cooperative  Airway & Oxygen Therapy: Patient Spontanous Breathing and Patient connected to nasal cannula oxygen  Post-op Assessment: Report given to PACU RN and Post -op Vital signs reviewed and stable  Post vital signs: Reviewed and stable  Complications: No apparent anesthesia complications

## 2012-04-03 NOTE — Anesthesia Preprocedure Evaluation (Addendum)
Anesthesia Evaluation  Patient identified by MRN, date of birth, ID band Patient awake, Patient confused and Patient unresponsive    Reviewed: Allergy & Precautions, H&P , NPO status , Patient's Chart, lab work & pertinent test results, reviewed documented beta blocker date and time   Airway  TM Distance: >3 FB Neck ROM: Full    Dental  (+) Edentulous Upper, Edentulous Lower and Dental Advisory Given   Pulmonary neg pulmonary ROS, former smoker,          Cardiovascular hypertension, Pt. on home beta blockers and Pt. on medications     Neuro/Psych CVA    GI/Hepatic negative GI ROS, Neg liver ROS,   Endo/Other  diabetes, Well Controlled, Type 2, Oral Hypoglycemic Agents  Renal/GU negative Renal ROS     Musculoskeletal negative musculoskeletal ROS (+)   Abdominal   Peds  Hematology   Anesthesia Other Findings   Reproductive/Obstetrics                         Anesthesia Physical Anesthesia Plan  ASA: III  Anesthesia Plan: General   Post-op Pain Management:    Induction: Intravenous  Airway Management Planned: Oral ETT  Additional Equipment:   Intra-op Plan:   Post-operative Plan: Extubation in OR  Informed Consent:   Plan Discussed with:   Anesthesia Plan Comments:         Anesthesia Quick Evaluation

## 2012-04-03 NOTE — Anesthesia Postprocedure Evaluation (Signed)
Anesthesia Post Note  Patient: [name] [name]  Procedure(s) Performed: Procedure(s) (LRB): EXCISION MASS (Left)  Anesthesia type: general  Patient location: PACU  Post pain: Pain level controlled  Post assessment: Patient's Cardiovascular Status Stable  Last Vitals:  Filed Vitals:   04/03/12 1100  BP: 167/63  Pulse: 85  Temp: 36.5 C  Resp:     Post vital signs: Reviewed and stable  Level of consciousness: sedated  Complications: No apparent anesthesia complications

## 2012-04-03 NOTE — Anesthesia Procedure Notes (Signed)
Procedure Name: Intubation Date/Time: 04/03/2012 9:08 AM Performed by: Darcey Nora B Pre-anesthesia Checklist: Patient identified, Emergency Drugs available, Suction available and Patient being monitored Patient Re-evaluated:Patient Re-evaluated prior to inductionOxygen Delivery Method: Circle system utilized Preoxygenation: Pre-oxygenation with 100% oxygen Intubation Type: IV induction Ventilation: Mask ventilation without difficulty Laryngoscope Size: Mac and 3 Grade View: Grade I Tube type: Oral Tube size: 7.5 mm Number of attempts: 1 Airway Equipment and Method: Stylet Placement Confirmation: ETT inserted through vocal cords under direct vision,  positive ETCO2 and CO2 detector Secured at: 21 (cm at gum) cm Tube secured with: Tape Dental Injury: Teeth and Oropharynx as per pre-operative assessment

## 2012-04-03 NOTE — Interval H&P Note (Signed)
History and Physical Interval Note:  04/03/2012 8:50 AM  [name] [name]  has presented today for surgery, with the diagnosis of LEFT NECK MASS   The various methods of treatment have been discussed with the patient and family. After consideration of risks, benefits and other options for treatment, the patient has consented to  Procedure(s) (LRB) with comments: EXCISION MASS (Left) - EXCISION LEFT NECK MASS  RADICAL NECK DISSECTION (Left) - POSSIBLE SELECTIVE NECK DISSECTION  as a surgical intervention .  The patient's history has been re-reviewed, patient re-examined, no change in status, stable for surgery.  I have re-reviewed the patient's chart and labs.  Questions were answered to the patient's satisfaction.     Flo Shanks

## 2012-04-03 NOTE — H&P (View-Only) (Signed)
[name],  [name] 61 y.o., female [id number]     Chief Complaint: LEFT neck mass  HPI: Preop visit before removal of a LEFT upper neck mass.  Although the history would be sufficient for thyroid cancer, thyroglobulin has remained low, and a needle aspiration in this area showed only fibrosis.  This may be a neuroma.  I do think resecting this for complete pathologic certainty is appropriate.   I discussed the surgery including risks and complications.  Questions were answered and informed consent was obtained.  Advancement of diet and activity including wound care were discussed.  Prescriptions given for Vicodin and Percocet.  3 months recheck.  Since we saw her last, an MRI scan of the neck showed a fairly discrete enhancing mass just inferior to the mastoid tip and basically in between the sternocleidomastoid and levator scapulae muscles.  It is 2.4 cm in greatest extent.  No other adenopathy noted.  Fine-needle aspiration showed skeletal muscle with dense fibrosis.   She is basically not aware of the mass except as people have been pointing it out and manipulating it.  She does not think it is growing.  No discomfort.  PMH: Past Medical History  Diagnosis Date  . Stroke 2009  . Thyroid disease   . Diabetes mellitus   . Hypertension   . Hyperlipidemia   . Cataract     Surg Hx: Past Surgical History  Procedure Date  . Thyroidectomy 2011 and 2012  . Total abdominal hysterectomy   . Carpal tunnel release     Bilateral    FHx:   Family History  Problem Relation Age of Onset  . Diabetes Mother    SocHx:  reports that she quit smoking about 28 years ago. Her smoking use included Cigarettes. She has never used smokeless tobacco. She reports that she does not drink alcohol or use illicit drugs.  ALLERGIES:  Allergies  Allergen Reactions  . Demerol (Meperidine) Hives     (Not in a hospital admission)  No results found for this or any previous visit (from the past 48  hour(s)). No results found.  ROS: occ hoarseness.  Weak sore LEFT shoulder.  There were no vitals taken for this visit.  PHYSICAL EXAM: She is energetic.  Her voice is slightly scratchy but phonatory.  She is breathing comfortably through her nose.  There is a firm but not hard mass in the upper sternocleidomastoid muscle on the LEFT side.  No other masses identified.   Lungs: Clear to auscultation Heart: Regular rate and rhythm without murmurs Abdomen: Soft and active Extremities: Normal configuration Neurologic: Symmetric and intact.  Studies Reviewed:  MRI neck    Assessment/Plan  Neck mass (784.2) (R22.1).  We are going to remove the LEFT neck mass next week.  I think there is  a fair chance that this is NOT thyroid cancer, and may just be leftover scar tissue from your previous surgeries.  Stop your aspirin and Plavix now.  Buy a small bottle of Hibiclens (Chlorhexidine generic) soap and shower with it the night before surgery and the morning of surgery.  Plan on staying in the hospital overnight one night.  I am giving you prescriptions for Vicodin for moderate pain relief, and for Percocet in case you need something stronger.    Recheck here 10 days post op.  Limited activities for 2 weeks.  Hydrocodone-Acetaminophen 5-325 MG Oral Tablet;TAKE 1 TO 2 TABLETS EVERY 4 TO 6 HOURS AS NEEDED FOR PAIN; Qty30; R2; Rx. Oxycodone-Acetaminophen   5-325 MG Oral Tablet;TAKE 1 TO 2 TABLETS EVERY 4 TO 6 HOURS AS NEEDED FOR PAIN; Qty20; R0; Rx.  [name] [name] 03/28/2012, 10:20 AM      

## 2012-04-03 NOTE — Op Note (Signed)
04/03/2012  10:21 AM    [name] [name]  [id number]   Pre-Op Dx:  Left upper neck mass   Post-op Dx: Same  Proc: Excision, left upper neck mass   Surg:  Flo Shanks T MD  Anes:  GOT  EBL:  Minimal  Comp:  None  Findings:  Fibrosis with a 1.5 cm central whitish tumor with a whorled internal appearance. 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  Procedure: With the patient in a comfortable supine position, general orotracheal anesthesia was induced without difficulty. At an appropriate level, the patient was placed in a slight reverse Trendelenburg. The head rotated to the right for access to the left neck. The mass was palpated. A sterile preparation and draping of the right left neck was accomplished in the standard fashion.  A 4 cm incision along the previous neck dissection incision was re-executed and carried down to the surface of the sternomastoid muscle. Superior and inferior flaps were elevated. The mass was palpable. Working directly through the sternocleidomastoid muscle, leaving a rim of muscular tissue, the mass was gradually dissected from the sternomastoid muscle down into the levator scapulae muscle. The mass did not have distinct margins but the bulk was felt to have been resected with some margin. Hemostasis was spontaneous.  The specimen was taken off the field and sectioned and then sent for frozen section interpretation.  While awaiting the frozen section interpretation, the wound was closed over a 1/4 inch Penrose drain. The drain was secured to the skin with a 3-0 nylon. The tissues were closed with 4-0 chromic. The skin was closed with a running simple 5-0 nylon.  When the frozen section returned as probable benign, no further surgery was felt indicated.  Bacitracin ointment and absorbent dressing was applied. Patient was returned to anesthesia, awakened, extubated, and transferred to recovery  in stable condition.  Dispo:   PACU to home  Plan:  Ice, elevation, analgesia. We'll remove the drain in 24 hours. Await permanent pathologic interpretation.  Cephus Richer MD

## 2012-04-04 ENCOUNTER — Encounter (HOSPITAL_COMMUNITY): Payer: Self-pay | Admitting: Otolaryngology

## 2012-07-01 ENCOUNTER — Other Ambulatory Visit: Payer: Self-pay

## 2012-07-01 DIAGNOSIS — Z1231 Encounter for screening mammogram for malignant neoplasm of breast: Secondary | ICD-10-CM

## 2012-08-07 ENCOUNTER — Ambulatory Visit
Admission: RE | Admit: 2012-08-07 | Discharge: 2012-08-07 | Disposition: A | Discharge status: Home / Self Care | Payer: PRIVATE HEALTH INSURANCE | Source: Ambulatory Visit

## 2012-08-07 DIAGNOSIS — Z1231 Encounter for screening mammogram for malignant neoplasm of breast: Secondary | ICD-10-CM

## 2012-08-28 ENCOUNTER — Other Ambulatory Visit: Payer: Self-pay | Admitting: Internal Medicine

## 2012-08-28 DIAGNOSIS — C73 Malignant neoplasm of thyroid gland: Secondary | ICD-10-CM

## 2012-08-30 IMAGING — CT CT CHEST W/ CM
3 of 7 series · 15 of 36 positions shown, 18 images · IV contrast (75CC OMNI 300)
Comparison: Chest CT on 06/02/2005; no prior neck CT

CT NECK

BUN and creatinine were obtained on site at [HOSPITAL] at
[HOSPITAL].
Results:  BUN 22 mg/dL,  Creatinine 1.0 mg/dL.
CLINICAL DATA: Thyroid carcinoma.  Left cervical lymphadenopathy.

CT NECK AND CHEST WITH CONTRAST
TECHNIQUE: Multidetector CT imaging of the neck and chest was
performed using the standard protocol after bolus administration of
intravenous contrast.
Contrast:  75 ml 1mnipaque-FLL

[Series 2: axial chest, neck · axial · 0.70mm/px · z∈[+206,+514]mm · 11 of 137 slices shown, 14 images]
[im 12/137  mediastinal]
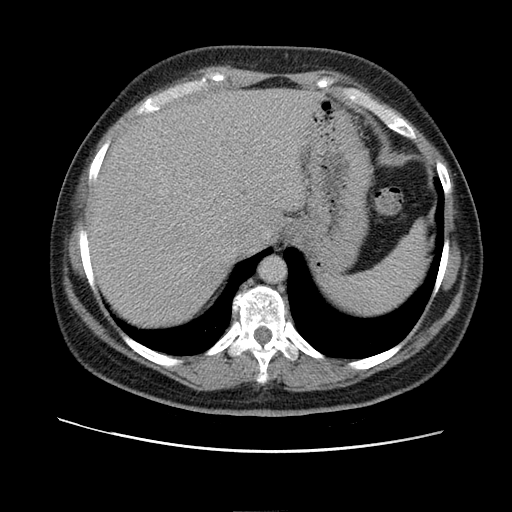
[im 12/137  lung]
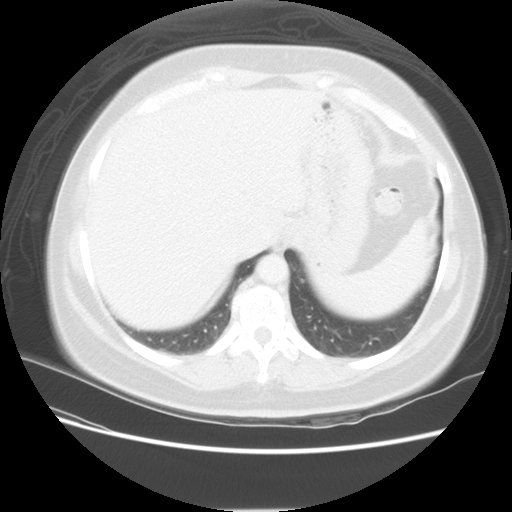
[im 23/137  lung]
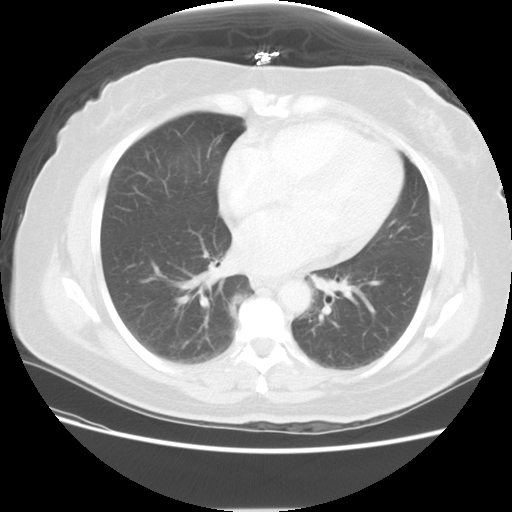
[im 35/137  lung]
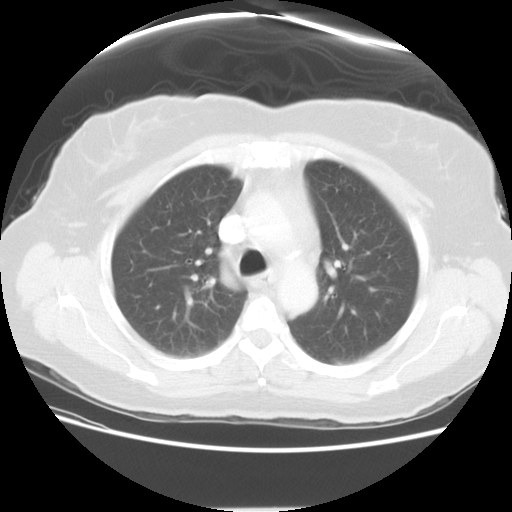
[im 46/137  lung]
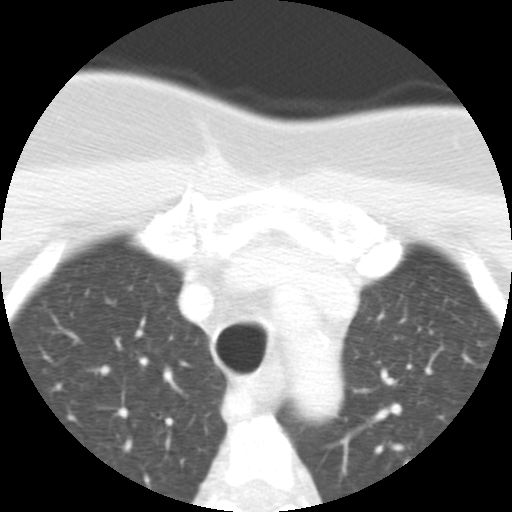
[im 57/137  mediastinal]
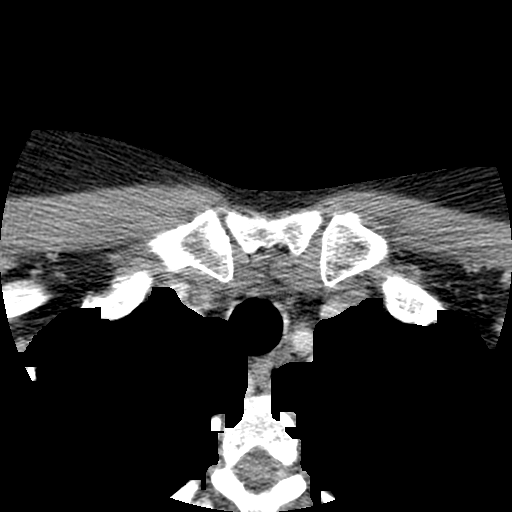
[im 57/137  lung]
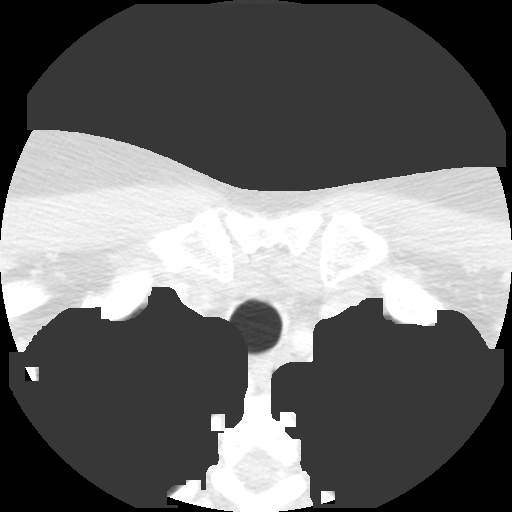
[im 69/137  lung]
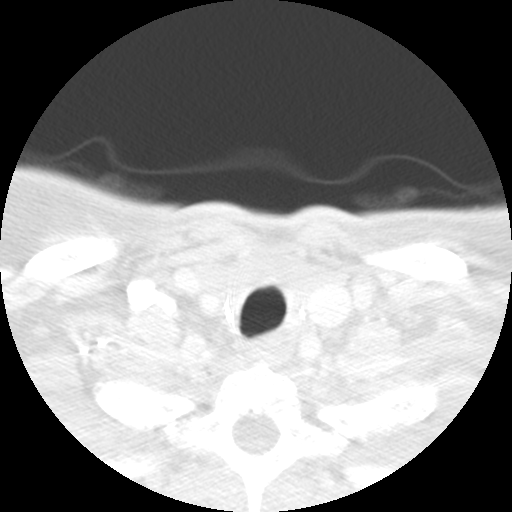
[im 80/137  lung]
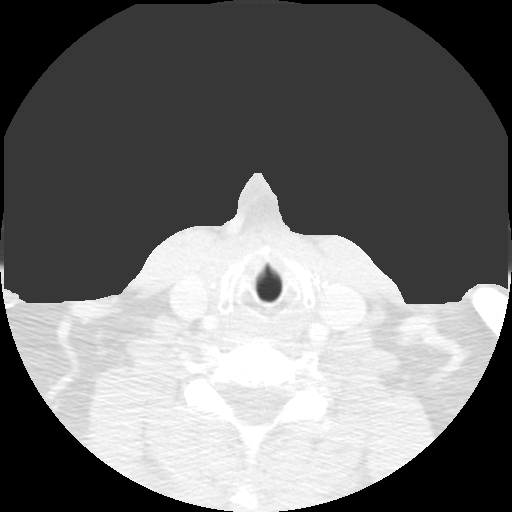
[im 91/137  lung]
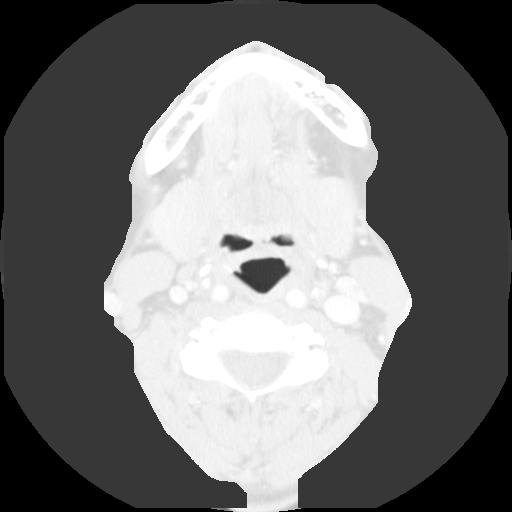
[im 103/137  mediastinal]
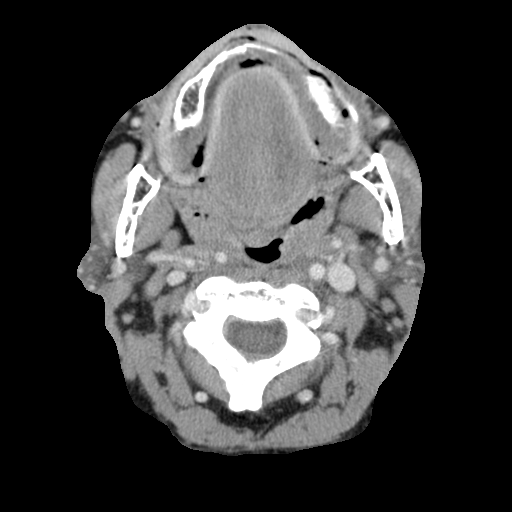
[im 103/137  lung]
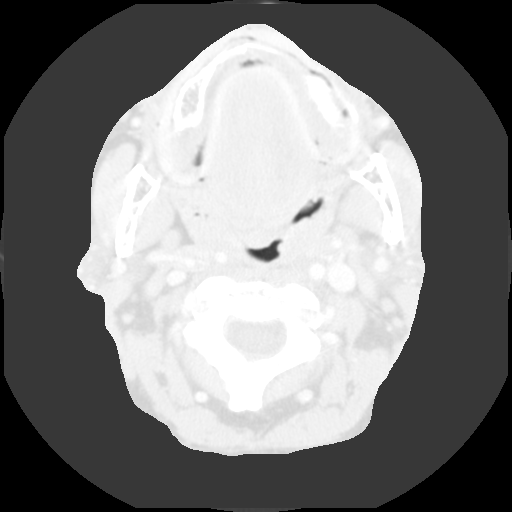
[im 114/137  lung]
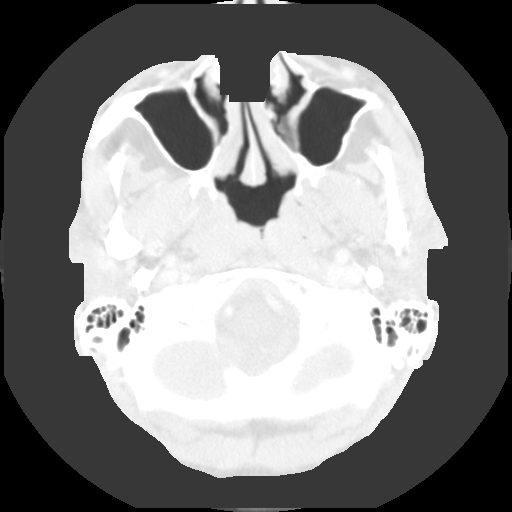
[im 125/137  lung]
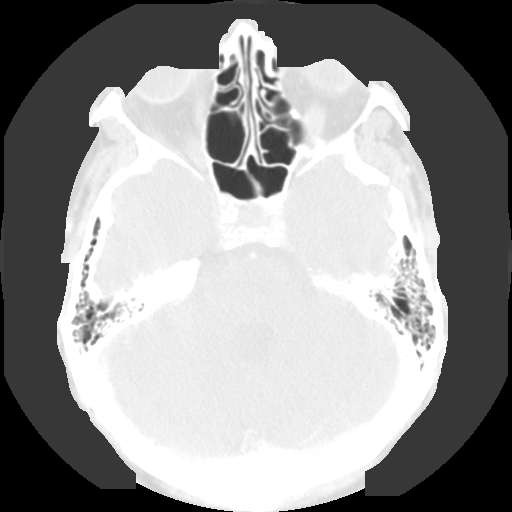

[Series 3: lung windows · axial · 0.70mm/px · z∈[+211,+331]mm · 3 of 50 slices shown]
[im 13/50  lung]
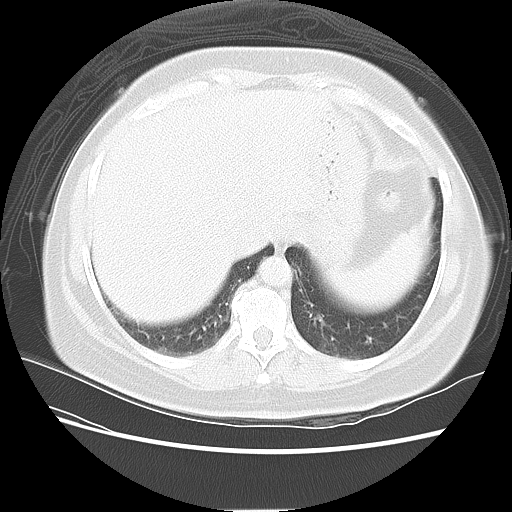
[im 25/50  lung]
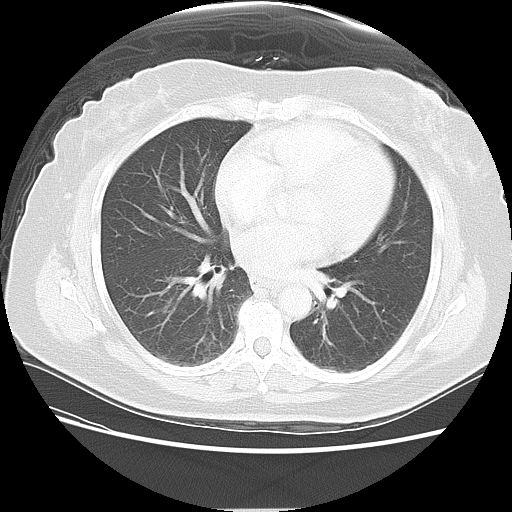
[im 37/50  lung]
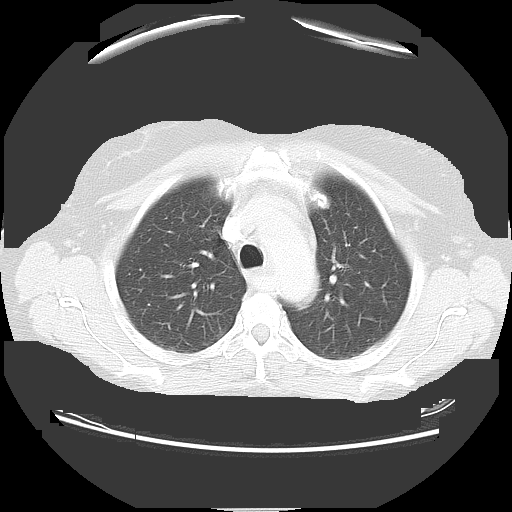

[Series 104: cor · sagittal · 0.43mm/px · 1 of 100 slices shown]
[im 50/100  lung]
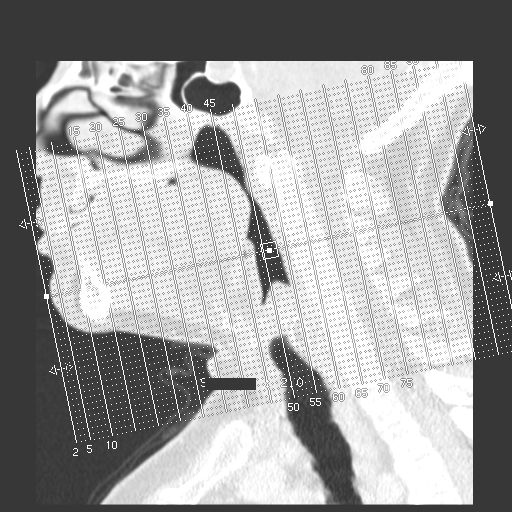

[15 of 36 positions shown; findings below may reference images not displayed]

FINDINGS: Previous thyroidectomy noted.  Shotty cervical lymph
nodes are seen bilaterally, however there are asymmetrically
enlarged and hypervascular lymph nodes seen in the left jugular
chain at levels II and III.  These measure up to 9 mm in short axis
diameter, and are suspicious for metastatic disease.

No other neck soft tissue masses are identified.  The vocal cords
and epiglottis are normal in appearance.  Salivary glands also
appear normal.
IMPRESSION: Asymmetric hypervascular left jugular chain nodes at levels II and
III, which measure up to 9 mm and are suspicious for metastatic
disease.

CT CHEST
FINDINGS: No mediastinal or hilar masses are identified.  No
adenopathy seen elsewhere within the thorax.  Incidentally noted is
a aberrant origin of the right subclavian artery, which is a normal
anatomic variant.

There is no evidence of pleural or pericardial effusion.  No
suspicious pulmonary nodules or masses are identified.  No evidence
of pulmonary infiltrate.  No suspicious bone lesions are
identified.
IMPRESSION: Negative.  No evidence of metastatic disease or other acute
findings within the thorax.

## 2012-09-03 ENCOUNTER — Ambulatory Visit
Admission: RE | Admit: 2012-09-03 | Discharge: 2012-09-03 | Disposition: A | Discharge status: Home / Self Care | Payer: PRIVATE HEALTH INSURANCE | Source: Ambulatory Visit | Attending: Internal Medicine | Admitting: Internal Medicine

## 2012-09-03 DIAGNOSIS — C73 Malignant neoplasm of thyroid gland: Secondary | ICD-10-CM

## 2012-09-09 ENCOUNTER — Other Ambulatory Visit: Payer: PRIVATE HEALTH INSURANCE

## 2012-11-07 ENCOUNTER — Emergency Department (HOSPITAL_COMMUNITY): Payer: No Typology Code available for payment source

## 2012-11-07 ENCOUNTER — Emergency Department (HOSPITAL_COMMUNITY)
Admission: EM | Admit: 2012-11-07 | Discharge: 2012-11-07 | Disposition: A | Discharge status: Home / Self Care | Payer: No Typology Code available for payment source | Attending: Emergency Medicine | Admitting: Emergency Medicine

## 2012-11-07 ENCOUNTER — Encounter (HOSPITAL_COMMUNITY): Payer: Self-pay | Admitting: Emergency Medicine

## 2012-11-07 DIAGNOSIS — S8010XA Contusion of unspecified lower leg, initial encounter: Secondary | ICD-10-CM | POA: Diagnosis not present

## 2012-11-07 DIAGNOSIS — Z7982 Long term (current) use of aspirin: Secondary | ICD-10-CM | POA: Insufficient documentation

## 2012-11-07 DIAGNOSIS — Z8673 Personal history of transient ischemic attack (TIA), and cerebral infarction without residual deficits: Secondary | ICD-10-CM | POA: Insufficient documentation

## 2012-11-07 DIAGNOSIS — M129 Arthropathy, unspecified: Secondary | ICD-10-CM | POA: Insufficient documentation

## 2012-11-07 DIAGNOSIS — E079 Disorder of thyroid, unspecified: Secondary | ICD-10-CM | POA: Insufficient documentation

## 2012-11-07 DIAGNOSIS — Y939 Activity, unspecified: Secondary | ICD-10-CM | POA: Insufficient documentation

## 2012-11-07 DIAGNOSIS — S20219A Contusion of unspecified front wall of thorax, initial encounter: Secondary | ICD-10-CM | POA: Insufficient documentation

## 2012-11-07 DIAGNOSIS — S20211A Contusion of right front wall of thorax, initial encounter: Secondary | ICD-10-CM

## 2012-11-07 DIAGNOSIS — Z85850 Personal history of malignant neoplasm of thyroid: Secondary | ICD-10-CM | POA: Insufficient documentation

## 2012-11-07 DIAGNOSIS — S8011XA Contusion of right lower leg, initial encounter: Secondary | ICD-10-CM

## 2012-11-07 DIAGNOSIS — Y92410 Unspecified street and highway as the place of occurrence of the external cause: Secondary | ICD-10-CM | POA: Insufficient documentation

## 2012-11-07 DIAGNOSIS — Z87891 Personal history of nicotine dependence: Secondary | ICD-10-CM | POA: Diagnosis not present

## 2012-11-07 DIAGNOSIS — Z79899 Other long term (current) drug therapy: Secondary | ICD-10-CM | POA: Diagnosis not present

## 2012-11-07 DIAGNOSIS — Z8669 Personal history of other diseases of the nervous system and sense organs: Secondary | ICD-10-CM | POA: Diagnosis not present

## 2012-11-07 DIAGNOSIS — E785 Hyperlipidemia, unspecified: Secondary | ICD-10-CM | POA: Diagnosis not present

## 2012-11-07 DIAGNOSIS — E119 Type 2 diabetes mellitus without complications: Secondary | ICD-10-CM | POA: Diagnosis not present

## 2012-11-07 DIAGNOSIS — S298XXA Other specified injuries of thorax, initial encounter: Secondary | ICD-10-CM | POA: Diagnosis present

## 2012-11-07 DIAGNOSIS — I10 Essential (primary) hypertension: Secondary | ICD-10-CM | POA: Insufficient documentation

## 2012-11-07 LAB — POCT I-STAT TROPONIN I: Troponin i, poc: 0.02 ng/mL (ref 0.00–0.08)

## 2012-11-07 MED ORDER — METHOCARBAMOL 500 MG PO TABS: 500.0000 mg | ORAL_TABLET | Freq: Four times a day (QID) | ORAL | Status: DC | PRN

## 2012-11-07 MED ORDER — HYDROCODONE-ACETAMINOPHEN 5-325 MG PO TABS: ORAL_TABLET | ORAL | Status: DC
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
## 2012-11-07 NOTE — ED Notes (Signed)
EKG shown to Dr. Romeo Apple

## 2012-11-07 NOTE — ED Notes (Signed)
Patient transported to X-ray 

## 2013-04-19 ENCOUNTER — Encounter (HOSPITAL_COMMUNITY): Payer: Self-pay | Admitting: Emergency Medicine

## 2013-04-19 ENCOUNTER — Emergency Department (HOSPITAL_COMMUNITY): Payer: PRIVATE HEALTH INSURANCE

## 2013-04-19 ENCOUNTER — Emergency Department (HOSPITAL_COMMUNITY)
Admission: EM | Admit: 2013-04-19 | Discharge: 2013-04-19 | Disposition: A | Discharge status: Home / Self Care | Payer: PRIVATE HEALTH INSURANCE | Attending: Emergency Medicine | Admitting: Emergency Medicine

## 2013-04-19 DIAGNOSIS — Z85850 Personal history of malignant neoplasm of thyroid: Secondary | ICD-10-CM | POA: Insufficient documentation

## 2013-04-19 DIAGNOSIS — Z87891 Personal history of nicotine dependence: Secondary | ICD-10-CM | POA: Insufficient documentation

## 2013-04-19 DIAGNOSIS — E079 Disorder of thyroid, unspecified: Secondary | ICD-10-CM | POA: Insufficient documentation

## 2013-04-19 DIAGNOSIS — R110 Nausea: Secondary | ICD-10-CM | POA: Insufficient documentation

## 2013-04-19 DIAGNOSIS — Z79899 Other long term (current) drug therapy: Secondary | ICD-10-CM | POA: Insufficient documentation

## 2013-04-19 DIAGNOSIS — R350 Frequency of micturition: Secondary | ICD-10-CM | POA: Insufficient documentation

## 2013-04-19 DIAGNOSIS — I10 Essential (primary) hypertension: Secondary | ICD-10-CM | POA: Insufficient documentation

## 2013-04-19 DIAGNOSIS — E119 Type 2 diabetes mellitus without complications: Secondary | ICD-10-CM | POA: Insufficient documentation

## 2013-04-19 DIAGNOSIS — M129 Arthropathy, unspecified: Secondary | ICD-10-CM | POA: Insufficient documentation

## 2013-04-19 DIAGNOSIS — R1032 Left lower quadrant pain: Secondary | ICD-10-CM | POA: Insufficient documentation

## 2013-04-19 DIAGNOSIS — Z7982 Long term (current) use of aspirin: Secondary | ICD-10-CM | POA: Insufficient documentation

## 2013-04-19 DIAGNOSIS — R109 Unspecified abdominal pain: Secondary | ICD-10-CM

## 2013-04-19 DIAGNOSIS — Z90710 Acquired absence of both cervix and uterus: Secondary | ICD-10-CM | POA: Insufficient documentation

## 2013-04-19 DIAGNOSIS — Z8673 Personal history of transient ischemic attack (TIA), and cerebral infarction without residual deficits: Secondary | ICD-10-CM | POA: Insufficient documentation

## 2013-04-19 DIAGNOSIS — E785 Hyperlipidemia, unspecified: Secondary | ICD-10-CM | POA: Insufficient documentation

## 2013-04-19 DIAGNOSIS — Z7902 Long term (current) use of antithrombotics/antiplatelets: Secondary | ICD-10-CM | POA: Insufficient documentation

## 2013-04-19 DIAGNOSIS — H269 Unspecified cataract: Secondary | ICD-10-CM | POA: Insufficient documentation

## 2013-04-19 LAB — COMPREHENSIVE METABOLIC PANEL
ALK PHOS: 70 U/L (ref 39–117)
ALT: 15 U/L (ref 0–35)
AST: 17 U/L (ref 0–37)
Albumin: 3.8 g/dL (ref 3.5–5.2)
BILIRUBIN TOTAL: 0.2 mg/dL — AB (ref 0.3–1.2)
BUN: 22 mg/dL (ref 6–23)
CHLORIDE: 94 meq/L — AB (ref 96–112)
CO2: 24 meq/L (ref 19–32)
Calcium: 9.3 mg/dL (ref 8.4–10.5)
Creatinine, Ser: 0.93 mg/dL (ref 0.50–1.10)
GFR calc Af Amer: 75 mL/min — ABNORMAL LOW (ref >90)
GFR, EST NON AFRICAN AMERICAN: 65 mL/min — AB (ref >90)
Glucose, Bld: 142 mg/dL — ABNORMAL HIGH (ref 70–99)
POTASSIUM: 3.6 meq/L — AB (ref 3.7–5.3)
SODIUM: 133 meq/L — AB (ref 137–147)
Total Protein: 8.2 g/dL (ref 6.0–8.3)

## 2013-04-19 LAB — URINALYSIS, ROUTINE W REFLEX MICROSCOPIC
BILIRUBIN URINE: NEGATIVE
Glucose, UA: NEGATIVE mg/dL
Ketones, ur: NEGATIVE mg/dL
Leukocytes, UA: NEGATIVE
Nitrite: NEGATIVE
PROTEIN: NEGATIVE mg/dL
Specific Gravity, Urine: 1.006 (ref 1.005–1.030)
UROBILINOGEN UA: 0.2 mg/dL (ref 0.0–1.0)
pH: 7 (ref 5.0–8.0)

## 2013-04-19 LAB — CBC WITH DIFFERENTIAL/PLATELET
BASOS ABS: 0 10*3/uL (ref 0.0–0.1)
Basophils Relative: 1 % (ref 0–1)
Eosinophils Absolute: 0.1 10*3/uL (ref 0.0–0.7)
Eosinophils Relative: 3 % (ref 0–5)
HEMATOCRIT: 35.6 % — AB (ref 36.0–46.0)
Hemoglobin: 12.4 g/dL (ref 12.0–15.0)
LYMPHS PCT: 33 % (ref 12–46)
Lymphs Abs: 1.6 10*3/uL (ref 0.7–4.0)
MCH: 27.1 pg (ref 26.0–34.0)
MCHC: 34.8 g/dL (ref 30.0–36.0)
MCV: 77.9 fL — ABNORMAL LOW (ref 78.0–100.0)
MONO ABS: 0.4 10*3/uL (ref 0.1–1.0)
Monocytes Relative: 9 % (ref 3–12)
NEUTROS ABS: 2.7 10*3/uL (ref 1.7–7.7)
Neutrophils Relative %: 55 % (ref 43–77)
PLATELETS: 285 10*3/uL (ref 150–400)
RBC: 4.57 MIL/uL (ref 3.87–5.11)
RDW: 14.2 % (ref 11.5–15.5)
WBC: 4.9 10*3/uL (ref 4.0–10.5)

## 2013-04-19 LAB — URINE MICROSCOPIC-ADD ON

## 2013-04-19 MED ORDER — ONDANSETRON HCL 4 MG/2ML IJ SOLN
4.0000 mg | Freq: Once | INTRAMUSCULAR | Status: AC
  Administered 2013-04-19: 4 mg via INTRAVENOUS
  Filled 2013-04-19: qty 2

## 2013-04-19 MED ORDER — MORPHINE SULFATE 4 MG/ML IJ SOLN
4.0000 mg | Freq: Once | INTRAMUSCULAR | Status: AC
  Administered 2013-04-19: 4 mg via INTRAVENOUS
  Filled 2013-04-19: qty 1

## 2013-04-19 MED ORDER — IOHEXOL 300 MG/ML  SOLN
100.0000 mL | Freq: Once | INTRAMUSCULAR | Status: AC | PRN
  Administered 2013-04-19: 100 mL via INTRAVENOUS

## 2013-04-19 MED ORDER — IOHEXOL 300 MG/ML  SOLN
25.0000 mL | INTRAMUSCULAR | Status: DC | PRN
  Administered 2013-04-19: 25 mL via ORAL

## 2013-04-19 NOTE — ED Notes (Signed)
Pt is here with left lower lateral abdominal pain, pt reports frequent urination, no hematuria. Pt feels nausea no diarrhea or vomiting

## 2013-04-19 NOTE — Discharge Instructions (Signed)
Abdominal Pain, Women °Abdominal (stomach, pelvic, or belly) pain can be caused by many things. It is important to tell your doctor: °· The location of the pain. °· Does it come and go or is it present all the time? °· Are there things that start the pain (eating certain foods, exercise)? °· Are there other symptoms associated with the pain (fever, nausea, vomiting, diarrhea)? °All of this is helpful to know when trying to find the cause of the pain. °CAUSES  °· Stomach: virus or bacteria infection, or ulcer. °· Intestine: appendicitis (inflamed appendix), regional ileitis (Crohn's disease), ulcerative colitis (inflamed colon), irritable bowel syndrome, diverticulitis (inflamed diverticulum of the colon), or cancer of the stomach or intestine. °· Gallbladder disease or stones in the gallbladder. °· Kidney disease, kidney stones, or infection. °· Pancreas infection or cancer. °· Fibromyalgia (pain disorder). °· Diseases of the female organs: °· Uterus: fibroid (non-cancerous) tumors or infection. °· Fallopian tubes: infection or tubal pregnancy. °· Ovary: cysts or tumors. °· Pelvic adhesions (scar tissue). °· Endometriosis (uterus lining tissue growing in the pelvis and on the pelvic organs). °· Pelvic congestion syndrome (female organs filling up with blood just before the menstrual period). °· Pain with the menstrual period. °· Pain with ovulation (producing an egg). °· Pain with an IUD (intrauterine device, birth control) in the uterus. °· Cancer of the female organs. °· Functional pain (pain not caused by a disease, may improve without treatment). °· Psychological pain. °· Depression. °DIAGNOSIS  °Your doctor will decide the seriousness of your pain by doing an examination. °· Blood tests. °· X-rays. °· Ultrasound. °· CT scan (computed tomography, special type of X-ray). °· MRI (magnetic resonance imaging). °· Cultures, for infection. °· Barium enema (dye inserted in the large intestine, to better view it with  X-rays). °· Colonoscopy (looking in intestine with a lighted tube). °· Laparoscopy (minor surgery, looking in abdomen with a lighted tube). °· Major abdominal exploratory surgery (looking in abdomen with a large incision). °TREATMENT  °The treatment will depend on the cause of the pain.  °· Many cases can be observed and treated at home. °· Over-the-counter medicines recommended by your caregiver. °· Prescription medicine. °· Antibiotics, for infection. °· Birth control pills, for painful periods or for ovulation pain. °· Hormone treatment, for endometriosis. °· Nerve blocking injections. °· Physical therapy. °· Antidepressants. °· Counseling with a psychologist or psychiatrist. °· Minor or major surgery. °HOME CARE INSTRUCTIONS  °· Do not take laxatives, unless directed by your caregiver. °· Take over-the-counter pain medicine only if ordered by your caregiver. Do not take aspirin because it can cause an upset stomach or bleeding. °· Try a clear liquid diet (broth or water) as ordered by your caregiver. Slowly move to a bland diet, as tolerated, if the pain is related to the stomach or intestine. °· Have a thermometer and take your temperature several times a day, and record it. °· Bed rest and sleep, if it helps the pain. °· Avoid sexual intercourse, if it causes pain. °· Avoid stressful situations. °· Keep your follow-up appointments and tests, as your caregiver orders. °· If the pain does not go away with medicine or surgery, you may try: °· Acupuncture. °· Relaxation exercises (yoga, meditation). °· Group therapy. °· Counseling. °SEEK MEDICAL CARE IF:  °· You notice certain foods cause stomach pain. °· Your home care treatment is not helping your pain. °· You need stronger pain medicine. °· You want your IUD removed. °· You feel faint or   lightheaded. °· You develop nausea and vomiting. °· You develop a rash. °· You are having side effects or an allergy to your medicine. °SEEK IMMEDIATE MEDICAL CARE IF:  °· Your  pain does not go away or gets worse. °· You have a fever. °· Your pain is felt only in portions of the abdomen. The right side could possibly be appendicitis. The left lower portion of the abdomen could be colitis or diverticulitis. °· You are passing blood in your stools (bright red or black tarry stools, with or without vomiting). °· You have blood in your urine. °· You develop chills, with or without a fever. °· You pass out. °MAKE SURE YOU:  °· Understand these instructions. °· Will watch your condition. °· Will get help right away if you are not doing well or get worse. °Document Released: 01/08/2007 Document Revised: 06/05/2011 Document Reviewed: 01/28/2009 °ExitCare® Patient Information ©2014 [employer]. ° °

## 2013-04-19 NOTE — ED Notes (Signed)
Pt completed oral contrast. Ct notified

## 2013-04-19 NOTE — ED Provider Notes (Signed)
CSN: [id number]     Arrival date & time 04/19/13  1224 History   First MD Initiated Contact with Patient 04/19/13 1235     Chief Complaint  Patient presents with  . Abdominal Pain   (Consider location/radiation/quality/duration/timing/severity/associated sxs/prior Treatment) HPI Comments: Patient presents to the emergency department with chief complaint of left lower abdominal pain. She states the pain began this morning. She associates the pain with frequent urination. She denies any dysuria, or hematuria. She additionally reports associated nausea, but no vomiting or diarrhea. No fevers or chills. She has not tried taking anything to alleviate her symptoms. Nothing makes the symptoms better or worse. She states the pain is 10 out of 10.  The history is provided by the patient. No language interpreter was used.    Past Medical History  Diagnosis Date  . Thyroid disease   . Diabetes mellitus   . Hyperlipidemia   . Cataract   . Hypertension     Dr. Montez Morita cardiologist  . Arthritis   . Thyroid cancer   . Stroke 2009    left arm and left leg neuropathy   Past Surgical History  Procedure Laterality Date  . Thyroidectomy  2011 and 2012  . Total abdominal hysterectomy    . Carpal tunnel release      Bilateral  . Mass excision  04/03/2012    Procedure: EXCISION MASS;  Surgeon: Jodi Marble, MD;  Location: [facility] OR;  Service: ENT;  Laterality: Left;  EXCISION LEFT NECK MASS    Family History  Problem Relation Age of Onset  . Diabetes Mother    History  Substance Use Topics  . Smoking status: Former Smoker -- 0.25 packs/day for 5 years    Types: Cigarettes    Quit date: 09/19/1983  . Smokeless tobacco: Never Used  . Alcohol Use: No   OB History   Grav Para Term Preterm Abortions TAB SAB Ect Mult Living                 Review of Systems  All other systems reviewed and are negative.    Allergies  Demerol  Home Medications   Current Outpatient Rx  Name  Route  Sig   Dispense  Refill  . amLODipine-valsartan (EXFORGE) 10-320 MG per tablet   Oral   Take 1 tablet by mouth daily.         Marland Kitchen aspirin EC 81 MG tablet   Oral   Take 81 mg by mouth daily.         . clopidogrel (PLAVIX) 75 MG tablet   Oral   Take 75 mg by mouth daily.         Marland Kitchen HYDROcodone-acetaminophen (NORCO/VICODIN) 5-325 MG per tablet      Take 1 or 2 po Q 6hrs for pain   12 tablet   0   . levothyroxine (SYNTHROID) 150 MCG tablet   Oral   Take 150 mcg by mouth daily before breakfast.          . lovastatin (MEVACOR) 40 MG tablet   Oral   Take 40 mg by mouth daily.          . metFORMIN (GLUCOPHAGE) 1000 MG tablet   Oral   Take 1,000 mg by mouth daily.          . methocarbamol (ROBAXIN) 500 MG tablet   Oral   Take 1 tablet (500 mg total) by mouth every 6 (six) hours as needed (muscle soreness).   [place]  tablet   0   . metoprolol succinate (TOPROL-XL) 50 MG 24 hr tablet   Oral   Take 50 mg by mouth daily. Take with or immediately following a meal.          BP 173/71  Pulse 78  Temp(Src) 97.9 F (36.6 C) (Oral)  Resp 22  SpO2 99% Physical Exam  Nursing note and vitals reviewed. Constitutional: She is oriented to person, place, and time. She appears well-developed and well-nourished.  HENT:  Head: Normocephalic and atraumatic.  Eyes: Conjunctivae and EOM are normal. Pupils are equal, round, and reactive to light.  Neck: Normal range of motion. Neck supple.  Cardiovascular: Normal rate and regular rhythm.  Exam reveals no gallop and no friction rub.   No murmur heard. Pulmonary/Chest: Effort normal and breath sounds normal. No respiratory distress. She has no wheezes. She has no rales. She exhibits no tenderness.  Abdominal: Soft. Bowel sounds are normal. She exhibits no distension and no mass. There is tenderness. There is no rebound and no guarding.  Left lower quadrant, and left flank tenderness palpation, no CVA tenderness, no other focal abdominal  tenderness, no right lower quadrant tenderness, or pain at McBurney's point, no right upper quadrant tenderness or Murphy sign, no fluid wave, or signs of peritonitis  Musculoskeletal: Normal range of motion. She exhibits no edema and no tenderness.  Neurological: She is alert and oriented to person, place, and time.  Skin: Skin is warm and dry.  Psychiatric: She has a normal mood and affect. Her behavior is normal. Judgment and thought content normal.    ED Course  Procedures (including critical care time) Results for orders placed during the hospital encounter of 04/19/13  CBC WITH DIFFERENTIAL      Result Value Range   WBC 4.9  4.0 - 10.5 K/uL   RBC 4.57  3.87 - 5.11 MIL/uL   Hemoglobin 12.4  12.0 - 15.0 g/dL   HCT 35.6 (*) 36.0 - 46.0 %   MCV 77.9 (*) 78.0 - 100.0 fL   MCH 27.1  26.0 - 34.0 pg   MCHC 34.8  30.0 - 36.0 g/dL   RDW 14.2  11.5 - 15.5 %   Platelets 285  150 - 400 K/uL   Neutrophils Relative % 55  43 - 77 %   Neutro Abs 2.7  1.7 - 7.7 K/uL   Lymphocytes Relative 33  12 - 46 %   Lymphs Abs 1.6  0.7 - 4.0 K/uL   Monocytes Relative 9  3 - 12 %   Monocytes Absolute 0.4  0.1 - 1.0 K/uL   Eosinophils Relative 3  0 - 5 %   Eosinophils Absolute 0.1  0.0 - 0.7 K/uL   Basophils Relative 1  0 - 1 %   Basophils Absolute 0.0  0.0 - 0.1 K/uL  COMPREHENSIVE METABOLIC PANEL      Result Value Range   Sodium 133 (*) 137 - 147 mEq/L   Potassium 3.6 (*) 3.7 - 5.3 mEq/L   Chloride 94 (*) 96 - 112 mEq/L   CO2 24  19 - 32 mEq/L   Glucose, Bld 142 (*) 70 - 99 mg/dL   BUN 22  6 - 23 mg/dL   Creatinine, Ser 0.93  0.50 - 1.10 mg/dL   Calcium 9.3  8.4 - 10.5 mg/dL   Total Protein 8.2  6.0 - 8.3 g/dL   Albumin 3.8  3.5 - 5.2 g/dL   AST 17  0 - 37  U/L   ALT 15  0 - 35 U/L   Alkaline Phosphatase 70  39 - 117 U/L   Total Bilirubin 0.2 (*) 0.3 - 1.2 mg/dL   GFR calc non Af Amer 65 (*) >90 mL/min   GFR calc Af Amer 75 (*) >90 mL/min  URINALYSIS, ROUTINE W REFLEX MICROSCOPIC      Result  Value Range   Color, Urine YELLOW  YELLOW   APPearance CLEAR  CLEAR   Specific Gravity, Urine 1.006  1.005 - 1.030   pH 7.0  5.0 - 8.0   Glucose, UA NEGATIVE  NEGATIVE mg/dL   Hgb urine dipstick SMALL (*) NEGATIVE   Bilirubin Urine NEGATIVE  NEGATIVE   Ketones, ur NEGATIVE  NEGATIVE mg/dL   Protein, ur NEGATIVE  NEGATIVE mg/dL   Urobilinogen, UA 0.2  0.0 - 1.0 mg/dL   Nitrite NEGATIVE  NEGATIVE   Leukocytes, UA NEGATIVE  NEGATIVE  URINE MICROSCOPIC-ADD ON      Result Value Range   Squamous Epithelial / LPF RARE  RARE   RBC / HPF 0-2  <3 RBC/hpf   Ct Abdomen Pelvis W Contrast  04/19/2013   CLINICAL DATA:  Left lower quadrant abdominal pain. Frequent urination.  EXAM: CT ABDOMEN AND PELVIS WITH CONTRAST  TECHNIQUE: Multidetector CT imaging of the abdomen and pelvis was performed using the standard protocol following bolus administration of intravenous contrast.  CONTRAST:  139mL OMNIPAQUE IOHEXOL 300 MG/ML SOLN. Contrast tolerated well without complication at the time of today's exam.  COMPARISON:  No similar prior exam is available at this institution for comparison or on [facility] PACS. Prior chest CT 05/31/2010.  FINDINGS: Minimal curvilinear right lower lobe scarring is re- demonstrated. Left lung bases clear.  The been gallstone noted within the otherwise normal-appearing gallbladder, image 28. Too small to characterize right upper renal pole cortical 3 mm hypodense lesion image 26, statistically most likely a cyst, but not further evaluated. Several too small to characterize subcentimeter left renal cortical hypodensities are identified. No hydroureteronephrosis or radiopaque renal or ureteral calculus. The bladder is distended.  Liver, adrenal glands, pancreas, and spleen are normal. Mild atheromatous aortic calcification without aneurysm. No ascites or lymphadenopathy.  Normal appendix. Small fat containing left inguinal hernia. No bowel wall thickening or focal segmental dilatation. Ovaries  are normal, image 64. Uterus presumed surgically absent.  Mild degenerative change at the sacroiliac joints noted. Multilevel mild lumbar spine disc degenerative change.  IMPRESSION: No acute intra-abdominal or pelvic pathology.   Electronically Signed   By: Conchita Paris M.D.   On: 04/19/2013 15:35     EKG Interpretation   None       MDM   1. Abdominal pain   2. Urinary frequency     Patient with frequency of urination, and left-sided abdominal tenderness, will check basic labs, urinalysis, treat pain and nausea, and will reassess. Suspect UTI. No fever, or diarrhea, but she does have some significant LLQ tenderness, which could indicate diverticulitis.  UA is clean.    CT is unremarkable. Patient is well-appearing. Labs are reassuring. Patient seen by and discussed with Dr. Mingo Amber, who agrees the patient can be discharged to home. No further workup today. Return precautions are given. Patient is stable and ready for discharge.  Montine Circle, PA-C 04/19/13 1621

## 2013-04-19 NOTE — ED Provider Notes (Signed)
Medical screening examination/treatment/procedure(s) were conducted as a shared visit with non-physician practitioner(s) and myself.  I personally evaluated the patient during the encounter.  EKG Interpretation   None        Patient here with left-sided abdominal pain. No fevers, no N/V/D. Mild LLQ pain on exam, no rebound or guarding. CT normal. Patient has been lifting multiple sewing machines over the past few days, her pain could be musculoskeletal in origin and thus causing her pain. Instructed to f/u with PCP.   Osvaldo Shipper, MD 04/19/13 1740

## 2013-06-11 ENCOUNTER — Other Ambulatory Visit: Payer: Self-pay | Admitting: Internal Medicine

## 2013-06-11 DIAGNOSIS — C73 Malignant neoplasm of thyroid gland: Secondary | ICD-10-CM

## 2013-06-13 ENCOUNTER — Ambulatory Visit
Admission: RE | Admit: 2013-06-13 | Discharge: 2013-06-13 | Disposition: A | Discharge status: Home / Self Care | Payer: PRIVATE HEALTH INSURANCE | Source: Ambulatory Visit | Attending: Internal Medicine | Admitting: Internal Medicine

## 2013-06-13 DIAGNOSIS — C73 Malignant neoplasm of thyroid gland: Secondary | ICD-10-CM

## 2013-06-30 ENCOUNTER — Other Ambulatory Visit: Payer: Self-pay

## 2013-06-30 DIAGNOSIS — Z1231 Encounter for screening mammogram for malignant neoplasm of breast: Secondary | ICD-10-CM

## 2013-08-11 ENCOUNTER — Encounter (INDEPENDENT_AMBULATORY_CARE_PROVIDER_SITE_OTHER): Payer: Self-pay

## 2013-08-11 ENCOUNTER — Ambulatory Visit
Admission: RE | Admit: 2013-08-11 | Discharge: 2013-08-11 | Disposition: A | Discharge status: Home / Self Care | Payer: PRIVATE HEALTH INSURANCE | Source: Ambulatory Visit

## 2013-08-11 DIAGNOSIS — Z1231 Encounter for screening mammogram for malignant neoplasm of breast: Secondary | ICD-10-CM

## 2014-07-02 ENCOUNTER — Other Ambulatory Visit: Payer: Self-pay

## 2014-07-02 DIAGNOSIS — Z1231 Encounter for screening mammogram for malignant neoplasm of breast: Secondary | ICD-10-CM

## 2014-08-03 ENCOUNTER — Other Ambulatory Visit: Payer: Self-pay | Admitting: Internal Medicine

## 2014-08-03 DIAGNOSIS — C73 Malignant neoplasm of thyroid gland: Secondary | ICD-10-CM

## 2014-08-13 ENCOUNTER — Ambulatory Visit
Admission: RE | Admit: 2014-08-13 | Discharge: 2014-08-13 | Disposition: A | Discharge status: Home / Self Care | Payer: Medicare Other | Source: Ambulatory Visit

## 2014-08-13 DIAGNOSIS — Z1231 Encounter for screening mammogram for malignant neoplasm of breast: Secondary | ICD-10-CM

## 2014-09-15 ENCOUNTER — Ambulatory Visit
Admission: RE | Admit: 2014-09-15 | Discharge: 2014-09-15 | Disposition: A | Discharge status: Home / Self Care | Payer: Medicare Other | Source: Ambulatory Visit | Attending: Internal Medicine | Admitting: Internal Medicine

## 2014-09-15 DIAGNOSIS — C73 Malignant neoplasm of thyroid gland: Secondary | ICD-10-CM

## 2015-07-12 ENCOUNTER — Other Ambulatory Visit: Payer: Self-pay

## 2015-07-12 DIAGNOSIS — Z1231 Encounter for screening mammogram for malignant neoplasm of breast: Secondary | ICD-10-CM

## 2015-09-08 ENCOUNTER — Ambulatory Visit: Payer: Medicare Other

## 2015-09-08 ENCOUNTER — Ambulatory Visit
Admission: RE | Admit: 2015-09-08 | Discharge: 2015-09-08 | Disposition: A | Discharge status: Home / Self Care | Payer: Medicare HMO | Source: Ambulatory Visit

## 2015-09-08 DIAGNOSIS — Z1231 Encounter for screening mammogram for malignant neoplasm of breast: Secondary | ICD-10-CM

## 2016-07-28 ENCOUNTER — Other Ambulatory Visit: Payer: Self-pay | Admitting: Cardiology

## 2016-07-28 DIAGNOSIS — Z1231 Encounter for screening mammogram for malignant neoplasm of breast: Secondary | ICD-10-CM

## 2016-09-05 ENCOUNTER — Other Ambulatory Visit: Payer: Self-pay | Admitting: Internal Medicine

## 2016-09-05 DIAGNOSIS — C73 Malignant neoplasm of thyroid gland: Secondary | ICD-10-CM

## 2016-09-11 ENCOUNTER — Ambulatory Visit: Payer: Medicare HMO

## 2016-09-25 ENCOUNTER — Ambulatory Visit
Admission: RE | Admit: 2016-09-25 | Discharge: 2016-09-25 | Disposition: A | Discharge status: Home / Self Care | Payer: Medicare HMO | Source: Ambulatory Visit | Attending: Cardiology | Admitting: Cardiology

## 2016-09-25 DIAGNOSIS — Z1231 Encounter for screening mammogram for malignant neoplasm of breast: Secondary | ICD-10-CM

## 2016-10-03 ENCOUNTER — Ambulatory Visit
Admission: RE | Admit: 2016-10-03 | Discharge: 2016-10-03 | Disposition: A | Discharge status: Home / Self Care | Payer: Medicare HMO | Source: Ambulatory Visit | Attending: Internal Medicine | Admitting: Internal Medicine

## 2016-10-03 DIAGNOSIS — C73 Malignant neoplasm of thyroid gland: Secondary | ICD-10-CM

## 2017-08-17 ENCOUNTER — Other Ambulatory Visit: Payer: Self-pay | Admitting: Cardiology

## 2017-08-17 DIAGNOSIS — Z1231 Encounter for screening mammogram for malignant neoplasm of breast: Secondary | ICD-10-CM

## 2017-09-26 ENCOUNTER — Ambulatory Visit
Admission: RE | Admit: 2017-09-26 | Discharge: 2017-09-26 | Disposition: A | Discharge status: Home / Self Care | Payer: Medicare HMO | Source: Ambulatory Visit | Attending: Cardiology | Admitting: Cardiology

## 2017-09-26 DIAGNOSIS — Z1231 Encounter for screening mammogram for malignant neoplasm of breast: Secondary | ICD-10-CM

## 2017-11-13 ENCOUNTER — Other Ambulatory Visit: Payer: Self-pay | Admitting: Internal Medicine

## 2017-11-13 DIAGNOSIS — C73 Malignant neoplasm of thyroid gland: Secondary | ICD-10-CM

## 2017-11-19 ENCOUNTER — Ambulatory Visit
Admission: RE | Admit: 2017-11-19 | Discharge: 2017-11-19 | Disposition: A | Discharge status: Home / Self Care | Payer: Medicare HMO | Source: Ambulatory Visit | Attending: Internal Medicine | Admitting: Internal Medicine

## 2017-11-19 DIAGNOSIS — C73 Malignant neoplasm of thyroid gland: Secondary | ICD-10-CM

## 2018-11-01 ENCOUNTER — Other Ambulatory Visit: Payer: Self-pay | Admitting: Cardiology

## 2018-11-01 DIAGNOSIS — Z1231 Encounter for screening mammogram for malignant neoplasm of breast: Secondary | ICD-10-CM

## 2018-12-18 ENCOUNTER — Ambulatory Visit
Admission: RE | Admit: 2018-12-18 | Discharge: 2018-12-18 | Disposition: A | Discharge status: Home / Self Care | Payer: Medicare HMO | Source: Ambulatory Visit | Attending: Cardiology | Admitting: Cardiology

## 2018-12-18 ENCOUNTER — Other Ambulatory Visit: Payer: Self-pay

## 2018-12-18 DIAGNOSIS — Z1231 Encounter for screening mammogram for malignant neoplasm of breast: Secondary | ICD-10-CM

## 2019-05-17 ENCOUNTER — Ambulatory Visit: Payer: Medicare HMO | Attending: Internal Medicine

## 2019-05-17 DIAGNOSIS — Z23 Encounter for immunization: Secondary | ICD-10-CM | POA: Insufficient documentation

## 2019-05-17 NOTE — Progress Notes (Signed)
   Covid-19 Vaccination Clinic  Name:  [name] [name]    MRN: [id number] DOB: December 17, 1950  05/17/2019  [name] was observed post Covid-19 immunization for 15 minutes without incidence. She was provided with Vaccine Information Sheet and instruction to access the V-Safe system.   [name] was instructed to call 911 with any severe reactions post vaccine: Marland Kitchen Difficulty breathing  . Swelling of your face and throat  . A fast heartbeat  . A bad rash all over your body  . Dizziness and weakness    Immunizations Administered    Name Date Dose VIS Date Route   Pfizer COVID-19 Vaccine 05/17/2019  8:40 AM 0.3 mL 03/07/2019 Intramuscular   Manufacturer: [place]   Lot: [id number]   [place]: [id number]

## 2019-06-09 ENCOUNTER — Ambulatory Visit: Payer: Medicare HMO | Attending: Internal Medicine

## 2019-06-09 DIAGNOSIS — Z23 Encounter for immunization: Secondary | ICD-10-CM

## 2019-06-09 NOTE — Progress Notes (Signed)
   Covid-19 Vaccination Clinic  Name:  YELONDA ASSEFA    MRN: [id number] DOB: Apr 21, 1950  06/09/2019  Ms. Mccommons was observed post Covid-19 immunization for 15 minutes without incident. She was provided with Vaccine Information Sheet and instruction to access the V-Safe system.   Ms. Blanke was instructed to call 911 with any severe reactions post vaccine: Marland Kitchen Difficulty breathing  . Swelling of face and throat  . A fast heartbeat  . A bad rash all over body  . Dizziness and weakness   Immunizations Administered    Name Date Dose VIS Date Route   Pfizer COVID-19 Vaccine 06/09/2019  8:19 AM 0.3 mL 03/07/2019 Intramuscular   Manufacturer: [place]   Lot: [id number]   [place]: [id number]

## 2019-08-20 ENCOUNTER — Encounter: Payer: Medicare HMO | Attending: Internal Medicine | Admitting: Nutrition

## 2019-08-20 ENCOUNTER — Other Ambulatory Visit: Payer: Self-pay

## 2019-08-20 DIAGNOSIS — E1165 Type 2 diabetes mellitus with hyperglycemia: Secondary | ICD-10-CM

## 2019-08-20 NOTE — Progress Notes (Signed)
Patient was identified by Name and DOB.  She is here to discuss diet and blood sugar readings.  Says her day is very stressful, with little time for herself.  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   SBGM:  Testing FBSs only.  ususally in the 160s-200s. Medication:  Basaglar 34u QAM Exercise: none Typical Day: 7-8AM: up. Taking care of son X1 hour. 8:15-10AM  Takes insulin and eats bfast: today nothing as yet, ususally supper from night before, hamburger, or 2 packets of sweetened oatmeal, with water to drink 1PM: sandwich 3:30: handful of chips 5-8:30 Dinner:  Protein-chicken-fired or baked, 2-6 ounces, 2 starch servings, (mac and cheese, or potatoes, or corn0, and one non starchy veg.  Rarely bread, water to drink 9-10 chips, popcorn,  Discussion: 1.Discussed how the 3 major food groups affect blood sugar and the importance of eating all 3 and the least amount of fat, as possible for health, and better blood sugar and less hunger between meals. 2.  Discussed importance of testing blood sugar during the day to see if blood sugars are remaining high for high carb breakfast when eating oatmeal.   Suggestions: 1.  Test 2hr. pc bfast or supper once a week.  Bottle of test strips given for this extra testing.   2.  Sheet given with lower calorie snack choices of less than 100-150 calories.  She will choose one from 150 Calorie at HS and 1 from the less than 100 calorie list during the day 3. Try to find 30 min. each day for self:  to sit/walk/reflect/read/ do what feels good for self.  Discussed importance of doing this.

## 2019-08-28 NOTE — Patient Instructions (Addendum)
1.  Test blood sugars 2hr. pc 3PM meal and at HS for 2 days.  I will call you to get those readings next week. 2.  Get low sodium hot dogs and bacon, crackers, limit cheese to 2X/wk, and eating out to 1X./q2wk. 3  Choose from lower calorie/lower sodium choices for snacks, and limit them to twice daily 4.  Please test blood sugar 2hr. After one meal each day:  Goal should be less thatn 180.  5.  Find 30 minutes each day for time for yourself.  Do whatever feels good.  Suggestions:  Walk, sit outside, read, etc.  6.  Return in one month.

## 2019-09-01 ENCOUNTER — Telehealth: Payer: Self-pay | Admitting: Nutrition

## 2019-09-01 NOTE — Telephone Encounter (Signed)
Phone call to see how her blood sugars and diet are doing.  Still only testing fasting.  Says one day it is good, and next day very high.  FBS today was 287.   She requested to come back to see me next week, and promised to keep a food history and will test fasting and 2hr. pc S.   Appointment scheduled for next week.

## 2019-09-10 ENCOUNTER — Encounter: Payer: Medicare HMO | Admitting: Nutrition

## 2019-09-24 ENCOUNTER — Encounter: Payer: Medicare HMO | Attending: Internal Medicine | Admitting: Nutrition

## 2019-09-24 ENCOUNTER — Other Ambulatory Visit: Payer: Self-pay

## 2019-09-24 DIAGNOSIS — E1165 Type 2 diabetes mellitus with hyperglycemia: Secondary | ICD-10-CM | POA: Diagnosis not present

## 2019-09-27 NOTE — Patient Instructions (Addendum)
1.  Test blood sugar 2hr. pc one meal each day.   2.  Mix 1 plain packet of oatmeal and one sweeteend packet.  Add small handful of nuts to this for protein 3.  Once OKed by MD, start walking program as discussed.   4.  Call if questions.

## 2019-09-27 NOTE — Progress Notes (Signed)
Patient was identified by name and DOB.  She is here for a one month follow-up appointment for dietary and blood sugar evaluation. SBGM  Still testing only once a day: FBS: 124-179.  FBS today: 163 LOW BSs:  Says 2X/month, in early morning 3-5 AM.  Puts hard candy by her bedside for this, and will take 1-2 pieces. Exercise: none  Plans to start in 2 weeks after stress test/ok by MD. Plans to "walk before breakfast (working up to 60 minutes 4-5X/wk) or will go back to the gym and use treadmill if weather not permitting." Typical day: 8-10AM: 34u Basaglar    Bfast: left overs from supper the night before.. Today: hamburger cooked in an airfrier.  No chips, coffee to drink with spenda. 12-2PM: lunch.  Now eating salads with grilled chicken and vegetables.  Using vinaigrette dressing.  2 packets. 3-4PM: 1 handful of chips. 6PM: supper,  Protein (4 ounces), 1-2 starch servings, and 1-2 non starchy veg.  Water to drink all day.  Has increased this to help with hunger.  No sweetened drinks 8PM: less than 1 pcket of nuts Suggestions;  1.  Gave praise for dietary changes and plan for exercise. 2.  Test blood sugar 2hr. pc one meal each day.   3.  Mix 1 plain packet of oatmeal and one sweeteend packet.  Add small handful of nuts to this for protein 4.  Once OKed by MD, start walking program as discussed.

## 2019-11-17 ENCOUNTER — Other Ambulatory Visit: Payer: Self-pay | Admitting: Cardiology

## 2019-11-17 DIAGNOSIS — Z1231 Encounter for screening mammogram for malignant neoplasm of breast: Secondary | ICD-10-CM

## 2019-12-19 ENCOUNTER — Other Ambulatory Visit: Payer: Self-pay

## 2019-12-19 ENCOUNTER — Ambulatory Visit
Admission: RE | Admit: 2019-12-19 | Discharge: 2019-12-19 | Disposition: A | Discharge status: Home / Self Care | Payer: Medicare HMO | Source: Ambulatory Visit | Attending: Cardiology | Admitting: Cardiology

## 2019-12-19 DIAGNOSIS — Z1231 Encounter for screening mammogram for malignant neoplasm of breast: Secondary | ICD-10-CM

## 2020-03-30 DIAGNOSIS — E890 Postprocedural hypothyroidism: Secondary | ICD-10-CM | POA: Diagnosis not present

## 2020-03-31 DIAGNOSIS — Z008 Encounter for other general examination: Secondary | ICD-10-CM | POA: Diagnosis not present

## 2020-03-31 DIAGNOSIS — I509 Heart failure, unspecified: Secondary | ICD-10-CM | POA: Diagnosis not present

## 2020-03-31 DIAGNOSIS — Z794 Long term (current) use of insulin: Secondary | ICD-10-CM | POA: Diagnosis not present

## 2020-03-31 DIAGNOSIS — E785 Hyperlipidemia, unspecified: Secondary | ICD-10-CM | POA: Diagnosis not present

## 2020-03-31 DIAGNOSIS — E1151 Type 2 diabetes mellitus with diabetic peripheral angiopathy without gangrene: Secondary | ICD-10-CM | POA: Diagnosis not present

## 2020-03-31 DIAGNOSIS — I4891 Unspecified atrial fibrillation: Secondary | ICD-10-CM | POA: Diagnosis not present

## 2020-03-31 DIAGNOSIS — E039 Hypothyroidism, unspecified: Secondary | ICD-10-CM | POA: Diagnosis not present

## 2020-03-31 DIAGNOSIS — I110 Hypertensive heart disease with heart failure: Secondary | ICD-10-CM | POA: Diagnosis not present

## 2020-03-31 DIAGNOSIS — E1159 Type 2 diabetes mellitus with other circulatory complications: Secondary | ICD-10-CM | POA: Diagnosis not present

## 2020-03-31 DIAGNOSIS — G8929 Other chronic pain: Secondary | ICD-10-CM | POA: Diagnosis not present

## 2020-05-01 DIAGNOSIS — I10 Essential (primary) hypertension: Secondary | ICD-10-CM | POA: Diagnosis not present

## 2020-05-01 DIAGNOSIS — N183 Chronic kidney disease, stage 3 unspecified: Secondary | ICD-10-CM | POA: Diagnosis not present

## 2020-05-01 DIAGNOSIS — E7800 Pure hypercholesterolemia, unspecified: Secondary | ICD-10-CM | POA: Diagnosis not present

## 2020-05-01 DIAGNOSIS — Z1159 Encounter for screening for other viral diseases: Secondary | ICD-10-CM | POA: Diagnosis not present

## 2020-05-01 DIAGNOSIS — Z6833 Body mass index (BMI) 33.0-33.9, adult: Secondary | ICD-10-CM | POA: Diagnosis not present

## 2020-05-01 DIAGNOSIS — I639 Cerebral infarction, unspecified: Secondary | ICD-10-CM | POA: Diagnosis not present

## 2020-05-01 DIAGNOSIS — E1165 Type 2 diabetes mellitus with hyperglycemia: Secondary | ICD-10-CM | POA: Diagnosis not present

## 2020-05-06 DIAGNOSIS — E890 Postprocedural hypothyroidism: Secondary | ICD-10-CM | POA: Diagnosis not present

## 2020-05-06 DIAGNOSIS — C73 Malignant neoplasm of thyroid gland: Secondary | ICD-10-CM | POA: Diagnosis not present

## 2020-05-06 DIAGNOSIS — N1832 Chronic kidney disease, stage 3b: Secondary | ICD-10-CM | POA: Diagnosis not present

## 2020-05-06 DIAGNOSIS — E1165 Type 2 diabetes mellitus with hyperglycemia: Secondary | ICD-10-CM | POA: Diagnosis not present

## 2020-05-20 DIAGNOSIS — E1165 Type 2 diabetes mellitus with hyperglycemia: Secondary | ICD-10-CM | POA: Diagnosis not present

## 2020-05-21 DIAGNOSIS — E1165 Type 2 diabetes mellitus with hyperglycemia: Secondary | ICD-10-CM | POA: Diagnosis not present

## 2020-05-21 DIAGNOSIS — E119 Type 2 diabetes mellitus without complications: Secondary | ICD-10-CM | POA: Diagnosis not present

## 2020-05-21 DIAGNOSIS — Z794 Long term (current) use of insulin: Secondary | ICD-10-CM | POA: Diagnosis not present

## 2020-05-29 DIAGNOSIS — R001 Bradycardia, unspecified: Secondary | ICD-10-CM | POA: Diagnosis not present

## 2020-05-29 DIAGNOSIS — N183 Chronic kidney disease, stage 3 unspecified: Secondary | ICD-10-CM | POA: Diagnosis not present

## 2020-05-29 DIAGNOSIS — E7800 Pure hypercholesterolemia, unspecified: Secondary | ICD-10-CM | POA: Diagnosis not present

## 2020-05-29 DIAGNOSIS — E1165 Type 2 diabetes mellitus with hyperglycemia: Secondary | ICD-10-CM | POA: Diagnosis not present

## 2020-05-29 DIAGNOSIS — Z6833 Body mass index (BMI) 33.0-33.9, adult: Secondary | ICD-10-CM | POA: Diagnosis not present

## 2020-05-29 DIAGNOSIS — I10 Essential (primary) hypertension: Secondary | ICD-10-CM | POA: Diagnosis not present

## 2020-05-29 DIAGNOSIS — I639 Cerebral infarction, unspecified: Secondary | ICD-10-CM | POA: Diagnosis not present

## 2020-05-29 DIAGNOSIS — Z79899 Other long term (current) drug therapy: Secondary | ICD-10-CM | POA: Diagnosis not present

## 2020-07-05 DIAGNOSIS — E890 Postprocedural hypothyroidism: Secondary | ICD-10-CM | POA: Diagnosis not present

## 2020-07-05 DIAGNOSIS — C73 Malignant neoplasm of thyroid gland: Secondary | ICD-10-CM | POA: Diagnosis not present

## 2020-07-05 DIAGNOSIS — E1165 Type 2 diabetes mellitus with hyperglycemia: Secondary | ICD-10-CM | POA: Diagnosis not present

## 2020-07-05 DIAGNOSIS — N1832 Chronic kidney disease, stage 3b: Secondary | ICD-10-CM | POA: Diagnosis not present

## 2020-07-17 DIAGNOSIS — E7800 Pure hypercholesterolemia, unspecified: Secondary | ICD-10-CM | POA: Diagnosis not present

## 2020-07-17 DIAGNOSIS — I639 Cerebral infarction, unspecified: Secondary | ICD-10-CM | POA: Diagnosis not present

## 2020-07-17 DIAGNOSIS — E1165 Type 2 diabetes mellitus with hyperglycemia: Secondary | ICD-10-CM | POA: Diagnosis not present

## 2020-07-17 DIAGNOSIS — N183 Chronic kidney disease, stage 3 unspecified: Secondary | ICD-10-CM | POA: Diagnosis not present

## 2020-07-17 DIAGNOSIS — Z6833 Body mass index (BMI) 33.0-33.9, adult: Secondary | ICD-10-CM | POA: Diagnosis not present

## 2020-07-17 DIAGNOSIS — I10 Essential (primary) hypertension: Secondary | ICD-10-CM | POA: Diagnosis not present

## 2020-07-23 DIAGNOSIS — E1165 Type 2 diabetes mellitus with hyperglycemia: Secondary | ICD-10-CM | POA: Diagnosis not present

## 2020-08-22 DIAGNOSIS — E1165 Type 2 diabetes mellitus with hyperglycemia: Secondary | ICD-10-CM | POA: Diagnosis not present

## 2020-08-24 DIAGNOSIS — E890 Postprocedural hypothyroidism: Secondary | ICD-10-CM | POA: Diagnosis not present

## 2020-09-21 DIAGNOSIS — E1165 Type 2 diabetes mellitus with hyperglycemia: Secondary | ICD-10-CM | POA: Diagnosis not present

## 2020-10-04 DIAGNOSIS — E1165 Type 2 diabetes mellitus with hyperglycemia: Secondary | ICD-10-CM | POA: Diagnosis not present

## 2020-10-04 DIAGNOSIS — E890 Postprocedural hypothyroidism: Secondary | ICD-10-CM | POA: Diagnosis not present

## 2020-10-04 DIAGNOSIS — C73 Malignant neoplasm of thyroid gland: Secondary | ICD-10-CM | POA: Diagnosis not present

## 2020-10-04 DIAGNOSIS — Z7984 Long term (current) use of oral hypoglycemic drugs: Secondary | ICD-10-CM | POA: Diagnosis not present

## 2020-10-04 DIAGNOSIS — N1832 Chronic kidney disease, stage 3b: Secondary | ICD-10-CM | POA: Diagnosis not present

## 2020-10-09 DIAGNOSIS — E1165 Type 2 diabetes mellitus with hyperglycemia: Secondary | ICD-10-CM | POA: Diagnosis not present

## 2020-10-09 DIAGNOSIS — Z79899 Other long term (current) drug therapy: Secondary | ICD-10-CM | POA: Diagnosis not present

## 2020-10-09 DIAGNOSIS — I639 Cerebral infarction, unspecified: Secondary | ICD-10-CM | POA: Diagnosis not present

## 2020-10-09 DIAGNOSIS — N183 Chronic kidney disease, stage 3 unspecified: Secondary | ICD-10-CM | POA: Diagnosis not present

## 2020-10-09 DIAGNOSIS — M129 Arthropathy, unspecified: Secondary | ICD-10-CM | POA: Diagnosis not present

## 2020-10-09 DIAGNOSIS — Z6833 Body mass index (BMI) 33.0-33.9, adult: Secondary | ICD-10-CM | POA: Diagnosis not present

## 2020-10-09 DIAGNOSIS — I10 Essential (primary) hypertension: Secondary | ICD-10-CM | POA: Diagnosis not present

## 2020-10-09 DIAGNOSIS — E7800 Pure hypercholesterolemia, unspecified: Secondary | ICD-10-CM | POA: Diagnosis not present

## 2020-10-14 DIAGNOSIS — M722 Plantar fascial fibromatosis: Secondary | ICD-10-CM | POA: Diagnosis not present

## 2020-10-14 DIAGNOSIS — M65871 Other synovitis and tenosynovitis, right ankle and foot: Secondary | ICD-10-CM | POA: Diagnosis not present

## 2020-10-14 DIAGNOSIS — M7731 Calcaneal spur, right foot: Secondary | ICD-10-CM | POA: Diagnosis not present

## 2020-10-14 DIAGNOSIS — M79671 Pain in right foot: Secondary | ICD-10-CM | POA: Diagnosis not present

## 2020-10-18 DIAGNOSIS — I517 Cardiomegaly: Secondary | ICD-10-CM | POA: Diagnosis not present

## 2020-10-18 DIAGNOSIS — I6523 Occlusion and stenosis of bilateral carotid arteries: Secondary | ICD-10-CM | POA: Diagnosis not present

## 2020-10-18 DIAGNOSIS — I739 Peripheral vascular disease, unspecified: Secondary | ICD-10-CM | POA: Diagnosis not present

## 2020-10-21 DIAGNOSIS — M65871 Other synovitis and tenosynovitis, right ankle and foot: Secondary | ICD-10-CM | POA: Diagnosis not present

## 2020-10-21 DIAGNOSIS — D485 Neoplasm of uncertain behavior of skin: Secondary | ICD-10-CM | POA: Diagnosis not present

## 2020-10-29 DIAGNOSIS — D2272 Melanocytic nevi of left lower limb, including hip: Secondary | ICD-10-CM | POA: Diagnosis not present

## 2020-10-29 DIAGNOSIS — D485 Neoplasm of uncertain behavior of skin: Secondary | ICD-10-CM | POA: Diagnosis not present

## 2020-10-29 DIAGNOSIS — E1165 Type 2 diabetes mellitus with hyperglycemia: Secondary | ICD-10-CM | POA: Diagnosis not present

## 2020-11-16 ENCOUNTER — Other Ambulatory Visit: Payer: Self-pay | Admitting: Cardiology

## 2020-11-16 DIAGNOSIS — Z1231 Encounter for screening mammogram for malignant neoplasm of breast: Secondary | ICD-10-CM

## 2020-11-28 DIAGNOSIS — E1165 Type 2 diabetes mellitus with hyperglycemia: Secondary | ICD-10-CM | POA: Diagnosis not present

## 2020-12-20 ENCOUNTER — Ambulatory Visit
Admission: RE | Admit: 2020-12-20 | Discharge: 2020-12-20 | Disposition: A | Discharge status: Home / Self Care | Payer: Medicare HMO | Source: Ambulatory Visit | Attending: Cardiology | Admitting: Cardiology

## 2020-12-20 ENCOUNTER — Other Ambulatory Visit: Payer: Self-pay

## 2020-12-20 DIAGNOSIS — Z1231 Encounter for screening mammogram for malignant neoplasm of breast: Secondary | ICD-10-CM

## 2020-12-28 DIAGNOSIS — E1165 Type 2 diabetes mellitus with hyperglycemia: Secondary | ICD-10-CM | POA: Diagnosis not present

## 2021-01-06 DIAGNOSIS — N1832 Chronic kidney disease, stage 3b: Secondary | ICD-10-CM | POA: Diagnosis not present

## 2021-01-06 DIAGNOSIS — J309 Allergic rhinitis, unspecified: Secondary | ICD-10-CM | POA: Diagnosis not present

## 2021-01-06 DIAGNOSIS — E890 Postprocedural hypothyroidism: Secondary | ICD-10-CM | POA: Diagnosis not present

## 2021-01-06 DIAGNOSIS — C73 Malignant neoplasm of thyroid gland: Secondary | ICD-10-CM | POA: Diagnosis not present

## 2021-01-06 DIAGNOSIS — Z794 Long term (current) use of insulin: Secondary | ICD-10-CM | POA: Diagnosis not present

## 2021-01-06 DIAGNOSIS — E1165 Type 2 diabetes mellitus with hyperglycemia: Secondary | ICD-10-CM | POA: Diagnosis not present

## 2021-01-14 DIAGNOSIS — N1832 Chronic kidney disease, stage 3b: Secondary | ICD-10-CM | POA: Diagnosis not present

## 2021-02-05 DIAGNOSIS — E1165 Type 2 diabetes mellitus with hyperglycemia: Secondary | ICD-10-CM | POA: Diagnosis not present

## 2021-03-07 DIAGNOSIS — E1165 Type 2 diabetes mellitus with hyperglycemia: Secondary | ICD-10-CM | POA: Diagnosis not present

## 2021-04-05 DIAGNOSIS — I129 Hypertensive chronic kidney disease with stage 1 through stage 4 chronic kidney disease, or unspecified chronic kidney disease: Secondary | ICD-10-CM | POA: Diagnosis not present

## 2021-04-05 DIAGNOSIS — E1122 Type 2 diabetes mellitus with diabetic chronic kidney disease: Secondary | ICD-10-CM | POA: Diagnosis not present

## 2021-04-05 DIAGNOSIS — N1832 Chronic kidney disease, stage 3b: Secondary | ICD-10-CM | POA: Diagnosis not present

## 2021-04-06 ENCOUNTER — Other Ambulatory Visit: Payer: Self-pay | Admitting: Nephrology

## 2021-04-06 DIAGNOSIS — E1165 Type 2 diabetes mellitus with hyperglycemia: Secondary | ICD-10-CM | POA: Diagnosis not present

## 2021-04-06 DIAGNOSIS — N1832 Chronic kidney disease, stage 3b: Secondary | ICD-10-CM

## 2021-04-07 DIAGNOSIS — Z7984 Long term (current) use of oral hypoglycemic drugs: Secondary | ICD-10-CM | POA: Diagnosis not present

## 2021-04-07 DIAGNOSIS — N1832 Chronic kidney disease, stage 3b: Secondary | ICD-10-CM | POA: Diagnosis not present

## 2021-04-07 DIAGNOSIS — I2510 Atherosclerotic heart disease of native coronary artery without angina pectoris: Secondary | ICD-10-CM | POA: Diagnosis not present

## 2021-04-07 DIAGNOSIS — E119 Type 2 diabetes mellitus without complications: Secondary | ICD-10-CM | POA: Diagnosis not present

## 2021-04-07 DIAGNOSIS — E669 Obesity, unspecified: Secondary | ICD-10-CM | POA: Diagnosis not present

## 2021-04-07 DIAGNOSIS — I10 Essential (primary) hypertension: Secondary | ICD-10-CM | POA: Diagnosis not present

## 2021-04-19 ENCOUNTER — Ambulatory Visit
Admission: RE | Admit: 2021-04-19 | Discharge: 2021-04-19 | Disposition: A | Discharge status: Home / Self Care | Payer: Medicare HMO | Source: Ambulatory Visit | Attending: Nephrology | Admitting: Nephrology

## 2021-04-19 DIAGNOSIS — N1832 Chronic kidney disease, stage 3b: Secondary | ICD-10-CM

## 2021-04-19 DIAGNOSIS — N189 Chronic kidney disease, unspecified: Secondary | ICD-10-CM | POA: Diagnosis not present

## 2021-04-20 DIAGNOSIS — N1832 Chronic kidney disease, stage 3b: Secondary | ICD-10-CM | POA: Diagnosis not present

## 2021-04-27 ENCOUNTER — Other Ambulatory Visit: Payer: Self-pay

## 2021-04-27 ENCOUNTER — Ambulatory Visit: Payer: Medicare HMO | Admitting: Cardiovascular Disease

## 2021-04-27 ENCOUNTER — Encounter: Payer: Self-pay | Admitting: Cardiovascular Disease

## 2021-04-27 VITALS — BP 148/86 | HR 63 | Ht 62.0 in | Wt 195.2 lb

## 2021-04-27 DIAGNOSIS — I700 Atherosclerosis of aorta: Secondary | ICD-10-CM | POA: Diagnosis not present

## 2021-04-27 DIAGNOSIS — E7800 Pure hypercholesterolemia, unspecified: Secondary | ICD-10-CM | POA: Insufficient documentation

## 2021-04-27 DIAGNOSIS — N1832 Chronic kidney disease, stage 3b: Secondary | ICD-10-CM | POA: Diagnosis not present

## 2021-04-27 DIAGNOSIS — I10 Essential (primary) hypertension: Secondary | ICD-10-CM

## 2021-04-27 DIAGNOSIS — E118 Type 2 diabetes mellitus with unspecified complications: Secondary | ICD-10-CM | POA: Diagnosis not present

## 2021-04-27 DIAGNOSIS — Z8679 Personal history of other diseases of the circulatory system: Secondary | ICD-10-CM | POA: Diagnosis not present

## 2021-04-27 DIAGNOSIS — I6522 Occlusion and stenosis of left carotid artery: Secondary | ICD-10-CM

## 2021-04-27 DIAGNOSIS — Z955 Presence of coronary angioplasty implant and graft: Secondary | ICD-10-CM | POA: Diagnosis not present

## 2021-04-27 DIAGNOSIS — Z794 Long term (current) use of insulin: Secondary | ICD-10-CM

## 2021-04-27 DIAGNOSIS — E039 Hypothyroidism, unspecified: Secondary | ICD-10-CM

## 2021-04-27 DIAGNOSIS — Z8673 Personal history of transient ischemic attack (TIA), and cerebral infarction without residual deficits: Secondary | ICD-10-CM | POA: Insufficient documentation

## 2021-04-27 MED ORDER — ATORVASTATIN CALCIUM 40 MG PO TABS: 40.0000 mg | ORAL_TABLET | Freq: Every day | ORAL | 3 refills | Status: AC

## 2021-04-27 NOTE — Addendum Note (Signed)
Addended by: Sanda Klein on: 04/27/2021 07:13 PM   Modules accepted: Orders

## 2021-04-27 NOTE — Patient Instructions (Addendum)
Medication Instructions:  INCREASE the Atorvastatin to 40 mg once daily  *If you need a refill on your cardiac medications before your next appointment, please call your pharmacy*   Lab Work: None ordered If you have labs (blood work) drawn today and your tests are completely normal, you will receive your results only by: [place] (if you have MyChart) OR A paper copy in the mail If you have any lab test that is abnormal or we need to change your treatment, we will call you to review the results.   Testing/Procedures: None ordered   Follow-Up: At [facility], you and your health needs are our priority.  As part of our continuing mission to provide you with exceptional heart care, we have created designated Provider Care Teams.  These Care Teams include your primary Cardiologist (physician) and Advanced Practice Providers (APPs -  Physician Assistants and Nurse Practitioners) who all work together to provide you with the care you need, when you need it.  We recommend signing up for the patient portal called "MyChart".  Sign up information is provided on this After Visit Summary.  MyChart is used to connect with patients for Virtual Visits (Telemedicine).  Patients are able to view lab/test results, encounter notes, upcoming appointments, etc.  Non-urgent messages can be sent to your provider as well.   To learn more about what you can do with MyChart, go to [email or web address].    Your next appointment:   12 month(s)  The format for your next appointment:   In Person  Provider:   Sanda Klein, MD

## 2021-04-27 NOTE — Progress Notes (Signed)
Cardiology Office Note:    Date:  04/27/2021   ID:  [name] [name], [name] [name] 14-Feb-1951, MRN [id number]  PCP:  Gwendel Hanson   [facility] Providers Cardiologist:  Sanda Klein, MD     Referring MD: Kathalene Frames, *   Chief Complaint  Patient presents with   [name] [name] is a 71 y.o. female who is being seen today for the evaluation of cardiac disease at the request of Kathalene Frames, *.   History of Present Illness:    [name] [name] is a 71 y.o. 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  The patient has been receiving care at [facility] and wishes to switch to [facility] heart care.  She received care from Dr. Jolyn Nap in the past, when she had RF ablation for AVNRT.  Denies problems with either angina or dyspnea at rest or with activity.  She does not have orthopnea, PND or significant lower extremity edema (legs may be slightly swollen towards the end of the day, normal by the morning).  Has not had palpitations, dizziness, syncope, claudication or new focal neurological complaints since her stroke in 2009.  She reports having a history of coronary disease and receiving stents, but I find no record of cardiac catheterization and coronary angiography in her chart.  She reports her last procedure was done in 2001.  I wonder if she considers her EP study and RF ablation to have been when she "received her stents".  She does not recall having a myocardial infarction and has never had angina pectoris.    She is taking chronic treatment with clopidogrel since the stroke that occurred in 2009 (right cerebral stroke without sequelae).  At that time carotid ultrasonography showed presence of nonobstructive atherosclerosis at the left carotid  bifurcation, no stenoses.  Not had any neurological events since then.  She recently had normal lower extremity ABIs.  Mild atheromatous aortic calcification without aneurysm is seen on CT of the abdomen from 2015.  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  Her ECG shows a right bundle branch block which was present on the tracing from 2014.  She is in sinus rhythm.  She is taking atorvastatin and low-dose for dyslipidemia and her LDL cholesterol was recently 109.  She is on both insulin and SGLT2 inhibitor for type II diabetes with poor control and recent hemoglobin A1c of 8.2%.  He is followed by Dr. Buddy Duty.  She has mildly abnormal kidney function with a creatinine that was 1.67 last October.  She is severely obese with a BMI of almost 39.  Has a history of bilateral carpal tunnel syndrome for which she underwent surgery.  In 2011 she was diagnosed with thyroid cancer and underwent complete thyroidectomy with a left modified neck dissection in 2012.  She has a longstanding history of smoking (20-pack-year history) but quit in 1985.  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  Past Medical History:  Diagnosis Date   Arthritis    Cataract    Diabetes mellitus    Hyperlipidemia    Hypertension    Dr. Montez Morita cardiologist   Stroke [facility]) 2009   left arm and left leg neuropathy  Thyroid cancer ([place])    Thyroid disease     Past Surgical History:  Procedure Laterality Date   CARPAL TUNNEL RELEASE     Bilateral   MASS EXCISION  04/03/2012   Procedure: EXCISION MASS;  Surgeon: Jodi Marble, MD;  Location: [facility];  Service: ENT;  Laterality: Left;  EXCISION LEFT NECK MASS    THYROIDECTOMY  2011 and 2012   TOTAL ABDOMINAL HYSTERECTOMY      Current Medications: Current Meds  Medication Sig   amLODipine (NORVASC) 10 MG tablet Take 10 mg by mouth daily.   aspirin EC 81 MG tablet Take 81 mg  by mouth daily.   atorvastatin (LIPITOR) 10 MG tablet Take 10 mg by mouth daily.   clopidogrel (PLAVIX) 75 MG tablet Take 75 mg by mouth daily.   Continuous Blood Gluc Receiver (FREESTYLE LIBRE 2 READER) DEVI 1 each.   dapagliflozin propanediol (FARXIGA) 10 MG TABS tablet Take 10 mg by mouth at bedtime.   Insulin Glargine (BASAGLAR KWIKPEN) 100 UNIT/ML Inject 32 Units into the skin daily.   levothyroxine (SYNTHROID) 150 MCG tablet Take 150 mcg by mouth daily before breakfast.    metoprolol succinate (TOPROL-XL) 50 MG 24 hr tablet Take 50 mg by mouth daily. Take with or immediately following a meal.     Allergies:   Demerol [meperidine]   Social History   Socioeconomic History   Marital status: Single    Spouse name: Not on file   Number of children: Not on file   Years of education: Not on file   Highest education level: Not on file  Occupational History   Not on file  Tobacco Use   Smoking status: Former    Packs/day: 0.25    Years: 5.00    Pack years: 1.25    Types: Cigarettes    Quit date: 09/19/1983    Years since quitting: 37.6   Smokeless tobacco: Never  Substance and Sexual Activity   Alcohol use: No   Drug use: No   Sexual activity: Not on file  Other Topics Concern   Not on file  Social History Narrative   Not on file   Social Determinants of Health   Financial Resource Strain: Not on file  Food Insecurity: Not on file  Transportation Needs: Not on file  Physical Activity: Not on file  Stress: Not on file  Social Connections: Not on file     Family History: The patient's family history includes Diabetes in her mother.  ROS:   Please see the history of present illness.     All other systems reviewed and are negative.  EKGs/Labs/Other Studies Reviewed:    The following studies were reviewed today: Notes from Dr. Okey Dupre at [place].  EKG:  EKG is  ordered today.  The ekg ordered today demonstrates normal sinus rhythm, right bundle  branch block (present in 2014), borderline QTC 468 ms  Recent Labs: No results found for requested labs within last 8760 hours.   01/14/2021 Creatinine 1.67 04/08/2020 Potassium 3.9, hemoglobin A1c 8.2%, ALT 11 Recent Lipid Panel    Component Value Date/Time   CHOL (H) 10/25/2007 0515    207        ATP III CLASSIFICATION:  <200     mg/dL   Desirable  200-239  mg/dL   Borderline High  >=240    mg/dL   High   TRIG 139 10/25/2007 0515   HDL 29 (L) 10/25/2007 0515   CHOLHDL 7.1 10/25/2007  0515   VLDL 28 10/25/2007 0515   LDLCALC (H) 10/25/2007 0515    150        Total Cholesterol/HDL:CHD Risk Coronary Heart Disease Risk Table                     Men   Women  1/2 Average Risk   3.4   3.3   01/06/2021 Cholesterol 179, HDL 47, LDL 109, triglycerides 132  Risk Assessment/Calculations:           Physical Exam:    VS:  BP (!) 148/86    Pulse 63    Ht 5\' 2"  (1.575 m)    Wt 195 lb 3.2 oz (88.5 kg)    SpO2 97%    BMI 35.70 kg/m     Wt Readings from Last 3 Encounters:  04/27/21 195 lb 3.2 oz (88.5 kg)  09/27/19 214 lb (97.1 kg)  08/20/19 215 lb 6.4 oz (97.7 kg)     GEN: Really obese, well nourished, well developed in no acute distress HEENT: Normal.  Tremulous voice suggestive of spasmodic dysphonia. NECK: No JVD; No carotid bruits LYMPHATICS: No lymphadenopathy CARDIAC: RRR, no murmurs, rubs, gallops.  Widely split second heart sound. RESPIRATORY:  Clear to auscultation without rales, wheezing or rhonchi  ABDOMEN: Soft, non-tender, non-distended MUSCULOSKELETAL:  No edema; No deformity  SKIN: Warm and dry NEUROLOGIC:  Alert and oriented x 3 PSYCHIATRIC:  Normal affect   ASSESSMENT:    1. History of coronary angioplasty with insertion of stent   2. Hypercholesteremia   3. History of PSVT (paroxysmal supraventricular tachycardia)   4. Type 2 diabetes mellitus with complication, with long-term current use of insulin ([place])   5. Essential hypertension   6.  Hypercholesterolemia   7. Severe obesity (BMI 35.0-39.9) with comorbidity ([place])   8. Stage 3b chronic kidney disease ([place])   9. Acquired hypothyroidism   10. Atherosclerosis of left carotid artery   11. Aortic atherosclerosis ([place])   12. History of arterial ischemic stroke    PLAN:    In order of problems listed above:  Reported history of CAD/coronary stent: Per her report she underwent placement of stents in the 80s or 90s, when these devices were not yet available.  She states that her last procedure was in 2001 when the records show she actually had a radiofrequency ablation for AV node reentry tachycardia.  As far as I can tell, she does not really have confirmed CAD, although she definitely has evidence of atherosclerosis in numerous coronary risk factors.  We will ask for the records of previous cardiovascular evaluation from [place]. 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  Her endocrinologist is Dr. Buddy Duty.  Please to see that she is on an SGLT2 inhibitor which should improve cardiovascular prognosis. HTN: Blood pressure is a little high today, but she reports that this is unusual for her.  When she saw Dr. Koleen Nimrod in the primary care office earlier this month her blood pressure was 152/80 which is also high.  Due to her CKD and diabetes, target blood pressure 130/80.  I did not make any changes to her medications yet today, while we gather more information.  If her blood pressure is persistently elevated, I would recommend increasing her dose of losartan. HLP: Although she has not required revascularization procedures, she does have evidence of atherosclerosis in the aorta and carotid arteries and has had  a previous stroke.  Target LDL less than 70.  Recommend increasing the dose of atorvastatin. Obesity: She has lost about 20 pounds in the last 2 years.  Additional weight loss would be highly beneficial  to improve her blood pressure and her metabolic parameters and prove overall cardiovascular outlook.  She does not have symptoms of hypersomnolence that would suggest obstructive sleep apnea. CKD3b: Presumably her renal function abnormalities are chronic, but we only have one recent time point to base that on, labs from October 2022; back in 2015 her creatinine was normal at about 0.9-1.0.  On ARB and SGLT2 inhibitor. Hypothyroidism status post thyroidectomy for cancer: On levothyroxine supplement Carotid atherosclerosis and aortic atherosclerosis: Based on head and neck MRA from 2009 and abdominal CT from 2015. History of ischemic stroke: Right hemispheric in 2009, no recurrence.           Medication Adjustments/Labs and Tests Ordered: Current medicines are reviewed at length with the patient today.  Concerns regarding medicines are outlined above.  Orders Placed This Encounter  Procedures   EKG 12-Lead   No orders of the defined types were placed in this encounter.   Patient Instructions  Medication Instructions:  No changes *If you need a refill on your cardiac medications before your next appointment, please call your pharmacy*   Lab Work: None ordered If you have labs (blood work) drawn today and your tests are completely normal, you will receive your results only by: [place] (if you have MyChart) OR A paper copy in the mail If you have any lab test that is abnormal or we need to change your treatment, we will call you to review the results.   Testing/Procedures: None ordered   Follow-Up: At [facility], you and your health needs are our priority.  As part of our continuing mission to provide you with exceptional heart care, we have created designated Provider Care Teams.  These Care Teams include your primary Cardiologist (physician) and Advanced Practice Providers (APPs -  Physician Assistants and Nurse Practitioners) who all work together to provide you with  the care you need, when you need it.  We recommend signing up for the patient portal called "MyChart".  Sign up information is provided on this After Visit Summary.  MyChart is used to connect with patients for Virtual Visits (Telemedicine).  Patients are able to view lab/test results, encounter notes, upcoming appointments, etc.  Non-urgent messages can be sent to your provider as well.   To learn more about what you can do with MyChart, go to [email or web address].    Your next appointment:   12 month(s)  The format for your next appointment:   In Person  Provider:   Sanda Klein, MD    Signed, Sanda Klein, MD  04/27/2021 7:10 PM    [place]

## 2021-05-09 DIAGNOSIS — J069 Acute upper respiratory infection, unspecified: Secondary | ICD-10-CM | POA: Diagnosis not present

## 2021-05-09 DIAGNOSIS — Z794 Long term (current) use of insulin: Secondary | ICD-10-CM | POA: Diagnosis not present

## 2021-05-09 DIAGNOSIS — I10 Essential (primary) hypertension: Secondary | ICD-10-CM | POA: Diagnosis not present

## 2021-05-09 DIAGNOSIS — E1165 Type 2 diabetes mellitus with hyperglycemia: Secondary | ICD-10-CM | POA: Diagnosis not present

## 2021-05-14 DIAGNOSIS — E1165 Type 2 diabetes mellitus with hyperglycemia: Secondary | ICD-10-CM | POA: Diagnosis not present

## 2021-05-23 DIAGNOSIS — E1165 Type 2 diabetes mellitus with hyperglycemia: Secondary | ICD-10-CM | POA: Diagnosis not present

## 2021-05-23 DIAGNOSIS — N1832 Chronic kidney disease, stage 3b: Secondary | ICD-10-CM | POA: Diagnosis not present

## 2021-05-23 DIAGNOSIS — Z794 Long term (current) use of insulin: Secondary | ICD-10-CM | POA: Diagnosis not present

## 2021-05-23 DIAGNOSIS — J309 Allergic rhinitis, unspecified: Secondary | ICD-10-CM | POA: Diagnosis not present

## 2021-05-26 DIAGNOSIS — E039 Hypothyroidism, unspecified: Secondary | ICD-10-CM | POA: Diagnosis not present

## 2021-05-26 DIAGNOSIS — Z008 Encounter for other general examination: Secondary | ICD-10-CM | POA: Diagnosis not present

## 2021-05-26 DIAGNOSIS — E1151 Type 2 diabetes mellitus with diabetic peripheral angiopathy without gangrene: Secondary | ICD-10-CM | POA: Diagnosis not present

## 2021-05-26 DIAGNOSIS — I69328 Other speech and language deficits following cerebral infarction: Secondary | ICD-10-CM | POA: Diagnosis not present

## 2021-05-26 DIAGNOSIS — D6869 Other thrombophilia: Secondary | ICD-10-CM | POA: Diagnosis not present

## 2021-05-26 DIAGNOSIS — I2510 Atherosclerotic heart disease of native coronary artery without angina pectoris: Secondary | ICD-10-CM | POA: Diagnosis not present

## 2021-05-26 DIAGNOSIS — E785 Hyperlipidemia, unspecified: Secondary | ICD-10-CM | POA: Diagnosis not present

## 2021-05-26 DIAGNOSIS — I4891 Unspecified atrial fibrillation: Secondary | ICD-10-CM | POA: Diagnosis not present

## 2021-05-26 DIAGNOSIS — I252 Old myocardial infarction: Secondary | ICD-10-CM | POA: Diagnosis not present

## 2021-05-26 DIAGNOSIS — Z794 Long term (current) use of insulin: Secondary | ICD-10-CM | POA: Diagnosis not present

## 2021-05-26 DIAGNOSIS — I10 Essential (primary) hypertension: Secondary | ICD-10-CM | POA: Diagnosis not present

## 2021-05-26 DIAGNOSIS — G8929 Other chronic pain: Secondary | ICD-10-CM | POA: Diagnosis not present

## 2021-06-13 DIAGNOSIS — E1165 Type 2 diabetes mellitus with hyperglycemia: Secondary | ICD-10-CM | POA: Diagnosis not present

## 2021-06-14 DIAGNOSIS — R69 Illness, unspecified: Secondary | ICD-10-CM | POA: Diagnosis not present

## 2021-06-14 DIAGNOSIS — I700 Atherosclerosis of aorta: Secondary | ICD-10-CM | POA: Diagnosis not present

## 2021-06-14 DIAGNOSIS — Z794 Long term (current) use of insulin: Secondary | ICD-10-CM | POA: Diagnosis not present

## 2021-06-14 DIAGNOSIS — I10 Essential (primary) hypertension: Secondary | ICD-10-CM | POA: Diagnosis not present

## 2021-06-14 DIAGNOSIS — Z0000 Encounter for general adult medical examination without abnormal findings: Secondary | ICD-10-CM | POA: Diagnosis not present

## 2021-06-14 DIAGNOSIS — E118 Type 2 diabetes mellitus with unspecified complications: Secondary | ICD-10-CM | POA: Diagnosis not present

## 2021-06-14 DIAGNOSIS — Z6839 Body mass index (BMI) 39.0-39.9, adult: Secondary | ICD-10-CM | POA: Diagnosis not present

## 2021-06-14 DIAGNOSIS — Z8673 Personal history of transient ischemic attack (TIA), and cerebral infarction without residual deficits: Secondary | ICD-10-CM | POA: Diagnosis not present

## 2021-07-08 DIAGNOSIS — J309 Allergic rhinitis, unspecified: Secondary | ICD-10-CM | POA: Diagnosis not present

## 2021-07-08 DIAGNOSIS — E890 Postprocedural hypothyroidism: Secondary | ICD-10-CM | POA: Diagnosis not present

## 2021-07-08 DIAGNOSIS — I2510 Atherosclerotic heart disease of native coronary artery without angina pectoris: Secondary | ICD-10-CM | POA: Diagnosis not present

## 2021-07-08 DIAGNOSIS — Z0000 Encounter for general adult medical examination without abnormal findings: Secondary | ICD-10-CM | POA: Diagnosis not present

## 2021-07-08 DIAGNOSIS — E669 Obesity, unspecified: Secondary | ICD-10-CM | POA: Diagnosis not present

## 2021-07-08 DIAGNOSIS — I700 Atherosclerosis of aorta: Secondary | ICD-10-CM | POA: Diagnosis not present

## 2021-07-08 DIAGNOSIS — Z8673 Personal history of transient ischemic attack (TIA), and cerebral infarction without residual deficits: Secondary | ICD-10-CM | POA: Diagnosis not present

## 2021-07-08 DIAGNOSIS — E785 Hyperlipidemia, unspecified: Secondary | ICD-10-CM | POA: Diagnosis not present

## 2021-07-08 DIAGNOSIS — N1832 Chronic kidney disease, stage 3b: Secondary | ICD-10-CM | POA: Diagnosis not present

## 2021-07-08 DIAGNOSIS — E1165 Type 2 diabetes mellitus with hyperglycemia: Secondary | ICD-10-CM | POA: Diagnosis not present

## 2021-07-08 DIAGNOSIS — Z794 Long term (current) use of insulin: Secondary | ICD-10-CM | POA: Diagnosis not present

## 2021-07-08 DIAGNOSIS — C73 Malignant neoplasm of thyroid gland: Secondary | ICD-10-CM | POA: Diagnosis not present

## 2021-07-08 DIAGNOSIS — I10 Essential (primary) hypertension: Secondary | ICD-10-CM | POA: Diagnosis not present

## 2021-07-08 DIAGNOSIS — I672 Cerebral atherosclerosis: Secondary | ICD-10-CM | POA: Diagnosis not present

## 2021-07-08 DIAGNOSIS — J302 Other seasonal allergic rhinitis: Secondary | ICD-10-CM | POA: Diagnosis not present

## 2021-07-08 DIAGNOSIS — Z1331 Encounter for screening for depression: Secondary | ICD-10-CM | POA: Diagnosis not present

## 2021-07-13 DIAGNOSIS — E1165 Type 2 diabetes mellitus with hyperglycemia: Secondary | ICD-10-CM | POA: Diagnosis not present

## 2021-07-15 DIAGNOSIS — I10 Essential (primary) hypertension: Secondary | ICD-10-CM | POA: Diagnosis not present

## 2021-07-15 DIAGNOSIS — E785 Hyperlipidemia, unspecified: Secondary | ICD-10-CM | POA: Diagnosis not present

## 2021-07-15 DIAGNOSIS — E1165 Type 2 diabetes mellitus with hyperglycemia: Secondary | ICD-10-CM | POA: Diagnosis not present

## 2021-07-15 DIAGNOSIS — N1832 Chronic kidney disease, stage 3b: Secondary | ICD-10-CM | POA: Diagnosis not present

## 2021-07-19 ENCOUNTER — Other Ambulatory Visit: Payer: Self-pay | Admitting: Internal Medicine

## 2021-07-19 DIAGNOSIS — I739 Peripheral vascular disease, unspecified: Secondary | ICD-10-CM

## 2021-07-26 ENCOUNTER — Other Ambulatory Visit: Payer: Medicare HMO

## 2021-07-26 ENCOUNTER — Ambulatory Visit
Admission: RE | Admit: 2021-07-26 | Discharge: 2021-07-26 | Disposition: A | Discharge status: Home / Self Care | Payer: Medicare HMO | Source: Ambulatory Visit | Attending: Internal Medicine | Admitting: Internal Medicine

## 2021-07-26 DIAGNOSIS — I739 Peripheral vascular disease, unspecified: Secondary | ICD-10-CM

## 2021-08-02 DIAGNOSIS — N1832 Chronic kidney disease, stage 3b: Secondary | ICD-10-CM | POA: Diagnosis not present

## 2021-08-05 ENCOUNTER — Ambulatory Visit
Admission: RE | Admit: 2021-08-05 | Discharge: 2021-08-05 | Disposition: A | Discharge status: Home / Self Care | Payer: Medicare HMO | Source: Ambulatory Visit | Attending: Internal Medicine | Admitting: Internal Medicine

## 2021-08-05 DIAGNOSIS — I739 Peripheral vascular disease, unspecified: Secondary | ICD-10-CM | POA: Diagnosis not present

## 2021-08-11 DIAGNOSIS — I129 Hypertensive chronic kidney disease with stage 1 through stage 4 chronic kidney disease, or unspecified chronic kidney disease: Secondary | ICD-10-CM | POA: Diagnosis not present

## 2021-08-11 DIAGNOSIS — N1832 Chronic kidney disease, stage 3b: Secondary | ICD-10-CM | POA: Diagnosis not present

## 2021-08-11 DIAGNOSIS — E1122 Type 2 diabetes mellitus with diabetic chronic kidney disease: Secondary | ICD-10-CM | POA: Diagnosis not present

## 2021-08-20 DIAGNOSIS — E1165 Type 2 diabetes mellitus with hyperglycemia: Secondary | ICD-10-CM | POA: Diagnosis not present

## 2021-08-24 DIAGNOSIS — E890 Postprocedural hypothyroidism: Secondary | ICD-10-CM | POA: Diagnosis not present

## 2021-08-30 ENCOUNTER — Telehealth: Payer: Self-pay | Admitting: Cardiovascular Disease

## 2021-08-30 NOTE — Telephone Encounter (Signed)
FYI--Adrianna with [facility] is calling to report a complaint made by the patient in regards to 2/01 appointment. Per Oley Balm, patient informed her that while in our office she was told her co-payment would be $25, but she was billed for $75. Adrianna confirmed the $75 is accurate for her plan, but unable to confirm whether or not patient was advised of the $25. She states patient was just concerned due to the actual co-pay being such a significant difference from what she was told it would be. She states it is their protocol to inform the office, but no further conversation is necessary.

## 2021-08-30 NOTE — Telephone Encounter (Signed)
Will route to billing.

## 2021-09-19 DIAGNOSIS — I10 Essential (primary) hypertension: Secondary | ICD-10-CM | POA: Diagnosis not present

## 2021-09-19 DIAGNOSIS — K649 Unspecified hemorrhoids: Secondary | ICD-10-CM | POA: Diagnosis not present

## 2021-09-19 DIAGNOSIS — E1165 Type 2 diabetes mellitus with hyperglycemia: Secondary | ICD-10-CM | POA: Diagnosis not present

## 2021-09-19 DIAGNOSIS — Z1211 Encounter for screening for malignant neoplasm of colon: Secondary | ICD-10-CM | POA: Diagnosis not present

## 2021-10-14 DIAGNOSIS — C73 Malignant neoplasm of thyroid gland: Secondary | ICD-10-CM | POA: Diagnosis not present

## 2021-10-14 DIAGNOSIS — E890 Postprocedural hypothyroidism: Secondary | ICD-10-CM | POA: Diagnosis not present

## 2021-10-14 DIAGNOSIS — N1832 Chronic kidney disease, stage 3b: Secondary | ICD-10-CM | POA: Diagnosis not present

## 2021-10-14 DIAGNOSIS — E1165 Type 2 diabetes mellitus with hyperglycemia: Secondary | ICD-10-CM | POA: Diagnosis not present

## 2021-10-14 DIAGNOSIS — Z794 Long term (current) use of insulin: Secondary | ICD-10-CM | POA: Diagnosis not present

## 2021-10-19 DIAGNOSIS — E1165 Type 2 diabetes mellitus with hyperglycemia: Secondary | ICD-10-CM | POA: Diagnosis not present

## 2021-11-23 DIAGNOSIS — Z86010 Personal history of colonic polyps: Secondary | ICD-10-CM | POA: Diagnosis not present

## 2021-11-23 DIAGNOSIS — Z09 Encounter for follow-up examination after completed treatment for conditions other than malignant neoplasm: Secondary | ICD-10-CM | POA: Diagnosis not present

## 2021-11-23 DIAGNOSIS — D124 Benign neoplasm of descending colon: Secondary | ICD-10-CM | POA: Diagnosis not present

## 2021-11-23 DIAGNOSIS — K648 Other hemorrhoids: Secondary | ICD-10-CM | POA: Diagnosis not present

## 2021-11-25 DIAGNOSIS — D124 Benign neoplasm of descending colon: Secondary | ICD-10-CM | POA: Diagnosis not present

## 2021-11-26 DIAGNOSIS — E1165 Type 2 diabetes mellitus with hyperglycemia: Secondary | ICD-10-CM | POA: Diagnosis not present

## 2021-12-02 ENCOUNTER — Encounter: Payer: Self-pay | Admitting: Internal Medicine

## 2021-12-26 DIAGNOSIS — E1165 Type 2 diabetes mellitus with hyperglycemia: Secondary | ICD-10-CM | POA: Diagnosis not present

## 2021-12-27 ENCOUNTER — Telehealth: Payer: Self-pay

## 2021-12-27 NOTE — Patient Outreach (Signed)
  Care Coordination   Initial Visit Note   12/27/2021 Name: [name] [name] MRN: [id number] DOB: [birth year]  [name] [name] is a 71 y.o. year old female who sees [name] Frames, MD for primary care. I spoke with  [name] [name] by phone today.  What matters to the patients health and wellness today?  No Concerns Expressed  Goals Addressed             This Visit's Progress    COMPLETED: Health Maintenance       Care Coordination Interventions: Reviewed plan for disease management. Reports adhering to treatment plans. Discussed plan for diabetes management. Reports improvements with self-management. A1C currently not at goal. Currently 7.8%. Denies current need for diabetes nutrition or education resources. Eye exam pending. Reviewed medications. Reports managing well. Denies concerns r/t medication management or prescription cost. Assessed social determinant of health barriers. AWV up to date. Completed on 07/08/21.           SDOH assessments and interventions completed:  Yes  SDOH Interventions Today    Flowsheet Row Most Recent Value  SDOH Interventions   Food Insecurity Interventions Intervention Not Indicated  Transportation Interventions Intervention Not Indicated        Care Coordination Interventions Activated:  Yes  Care Coordination Interventions:  Yes, provided   Follow up plan:  [name] agreed to call for care coordination assistance as needed.    Encounter Outcome:  Pt. Visit Completed   [place] [facility] [phone]

## 2022-01-02 NOTE — Patient Instructions (Signed)
Visit Information  Thank you for taking time to speak with me. Please do not hesitate to call if you require assistance.  Following are the goals we discussed today:   Goals Addressed             This Visit's Progress    COMPLETED: Health Maintenance       Care Coordination Interventions: Reviewed plan for disease management. Reports adhering to treatment plans. Discussed plan for diabetes management. Reports improvements with self-management. A1C currently not at goal. Currently 7.8%. Denies current need for diabetes nutrition or education resources. Eye exam pending. Reviewed medications. Reports managing well. Denies concerns r/t medication management or prescription cost. Assessed social determinant of health barriers. AWV up to date. Completed on 07/08/21.        Ms. Branaman verbalized understanding of the information discussed during the telephonic outreach. Declined need for mailed instructions or resources.  Ms. Ishler will call for care coordination assistance as needed.  [place] [facility] [phone]

## 2022-01-04 DIAGNOSIS — M79641 Pain in right hand: Secondary | ICD-10-CM | POA: Diagnosis not present

## 2022-01-04 DIAGNOSIS — N1832 Chronic kidney disease, stage 3b: Secondary | ICD-10-CM | POA: Diagnosis not present

## 2022-01-04 DIAGNOSIS — E890 Postprocedural hypothyroidism: Secondary | ICD-10-CM | POA: Diagnosis not present

## 2022-01-04 DIAGNOSIS — I10 Essential (primary) hypertension: Secondary | ICD-10-CM | POA: Diagnosis not present

## 2022-01-04 DIAGNOSIS — E1165 Type 2 diabetes mellitus with hyperglycemia: Secondary | ICD-10-CM | POA: Diagnosis not present

## 2022-01-10 ENCOUNTER — Other Ambulatory Visit: Payer: Self-pay | Admitting: Internal Medicine

## 2022-01-10 DIAGNOSIS — Z1231 Encounter for screening mammogram for malignant neoplasm of breast: Secondary | ICD-10-CM

## 2022-01-16 DIAGNOSIS — C73 Malignant neoplasm of thyroid gland: Secondary | ICD-10-CM | POA: Diagnosis not present

## 2022-01-16 DIAGNOSIS — N1832 Chronic kidney disease, stage 3b: Secondary | ICD-10-CM | POA: Diagnosis not present

## 2022-01-16 DIAGNOSIS — Z794 Long term (current) use of insulin: Secondary | ICD-10-CM | POA: Diagnosis not present

## 2022-01-16 DIAGNOSIS — E1165 Type 2 diabetes mellitus with hyperglycemia: Secondary | ICD-10-CM | POA: Diagnosis not present

## 2022-01-16 DIAGNOSIS — E890 Postprocedural hypothyroidism: Secondary | ICD-10-CM | POA: Diagnosis not present

## 2022-01-25 DIAGNOSIS — E1165 Type 2 diabetes mellitus with hyperglycemia: Secondary | ICD-10-CM | POA: Diagnosis not present

## 2022-02-06 DIAGNOSIS — N1832 Chronic kidney disease, stage 3b: Secondary | ICD-10-CM | POA: Diagnosis not present

## 2022-02-13 DIAGNOSIS — E1122 Type 2 diabetes mellitus with diabetic chronic kidney disease: Secondary | ICD-10-CM | POA: Diagnosis not present

## 2022-02-13 DIAGNOSIS — I129 Hypertensive chronic kidney disease with stage 1 through stage 4 chronic kidney disease, or unspecified chronic kidney disease: Secondary | ICD-10-CM | POA: Diagnosis not present

## 2022-02-13 DIAGNOSIS — Z8673 Personal history of transient ischemic attack (TIA), and cerebral infarction without residual deficits: Secondary | ICD-10-CM | POA: Diagnosis not present

## 2022-02-13 DIAGNOSIS — N1832 Chronic kidney disease, stage 3b: Secondary | ICD-10-CM | POA: Diagnosis not present

## 2022-02-15 DIAGNOSIS — I10 Essential (primary) hypertension: Secondary | ICD-10-CM | POA: Diagnosis not present

## 2022-02-15 DIAGNOSIS — N1832 Chronic kidney disease, stage 3b: Secondary | ICD-10-CM | POA: Diagnosis not present

## 2022-02-27 ENCOUNTER — Ambulatory Visit
Admission: RE | Admit: 2022-02-27 | Discharge: 2022-02-27 | Disposition: A | Discharge status: Home / Self Care | Payer: Medicare HMO | Source: Ambulatory Visit | Attending: Internal Medicine | Admitting: Internal Medicine

## 2022-02-27 DIAGNOSIS — Z1231 Encounter for screening mammogram for malignant neoplasm of breast: Secondary | ICD-10-CM

## 2022-02-28 DIAGNOSIS — N1832 Chronic kidney disease, stage 3b: Secondary | ICD-10-CM | POA: Diagnosis not present

## 2022-03-06 DIAGNOSIS — E1165 Type 2 diabetes mellitus with hyperglycemia: Secondary | ICD-10-CM | POA: Diagnosis not present

## 2022-03-09 DIAGNOSIS — I10 Essential (primary) hypertension: Secondary | ICD-10-CM | POA: Diagnosis not present

## 2022-04-05 DIAGNOSIS — E1165 Type 2 diabetes mellitus with hyperglycemia: Secondary | ICD-10-CM | POA: Diagnosis not present

## 2022-04-12 DIAGNOSIS — Z0100 Encounter for examination of eyes and vision without abnormal findings: Secondary | ICD-10-CM | POA: Diagnosis not present

## 2022-04-12 DIAGNOSIS — E119 Type 2 diabetes mellitus without complications: Secondary | ICD-10-CM | POA: Diagnosis not present

## 2022-04-24 DIAGNOSIS — C73 Malignant neoplasm of thyroid gland: Secondary | ICD-10-CM | POA: Diagnosis not present

## 2022-04-24 DIAGNOSIS — N1832 Chronic kidney disease, stage 3b: Secondary | ICD-10-CM | POA: Diagnosis not present

## 2022-04-24 DIAGNOSIS — E890 Postprocedural hypothyroidism: Secondary | ICD-10-CM | POA: Diagnosis not present

## 2022-04-24 DIAGNOSIS — E1165 Type 2 diabetes mellitus with hyperglycemia: Secondary | ICD-10-CM | POA: Diagnosis not present

## 2022-04-24 DIAGNOSIS — Z794 Long term (current) use of insulin: Secondary | ICD-10-CM | POA: Diagnosis not present

## 2022-05-05 DIAGNOSIS — E1165 Type 2 diabetes mellitus with hyperglycemia: Secondary | ICD-10-CM | POA: Diagnosis not present

## 2022-05-06 DIAGNOSIS — I509 Heart failure, unspecified: Secondary | ICD-10-CM | POA: Diagnosis not present

## 2022-05-06 DIAGNOSIS — R32 Unspecified urinary incontinence: Secondary | ICD-10-CM | POA: Diagnosis not present

## 2022-05-06 DIAGNOSIS — E1151 Type 2 diabetes mellitus with diabetic peripheral angiopathy without gangrene: Secondary | ICD-10-CM | POA: Diagnosis not present

## 2022-05-06 DIAGNOSIS — E785 Hyperlipidemia, unspecified: Secondary | ICD-10-CM | POA: Diagnosis not present

## 2022-05-06 DIAGNOSIS — E1165 Type 2 diabetes mellitus with hyperglycemia: Secondary | ICD-10-CM | POA: Diagnosis not present

## 2022-05-06 DIAGNOSIS — Z794 Long term (current) use of insulin: Secondary | ICD-10-CM | POA: Diagnosis not present

## 2022-05-06 DIAGNOSIS — I4891 Unspecified atrial fibrillation: Secondary | ICD-10-CM | POA: Diagnosis not present

## 2022-05-06 DIAGNOSIS — Z008 Encounter for other general examination: Secondary | ICD-10-CM | POA: Diagnosis not present

## 2022-05-06 DIAGNOSIS — D6869 Other thrombophilia: Secondary | ICD-10-CM | POA: Diagnosis not present

## 2022-05-06 DIAGNOSIS — I2510 Atherosclerotic heart disease of native coronary artery without angina pectoris: Secondary | ICD-10-CM | POA: Diagnosis not present

## 2022-05-06 DIAGNOSIS — I252 Old myocardial infarction: Secondary | ICD-10-CM | POA: Diagnosis not present

## 2022-05-06 DIAGNOSIS — J302 Other seasonal allergic rhinitis: Secondary | ICD-10-CM | POA: Diagnosis not present

## 2022-06-13 DIAGNOSIS — E1165 Type 2 diabetes mellitus with hyperglycemia: Secondary | ICD-10-CM | POA: Diagnosis not present

## 2022-06-17 DIAGNOSIS — E119 Type 2 diabetes mellitus without complications: Secondary | ICD-10-CM | POA: Diagnosis not present

## 2022-06-17 DIAGNOSIS — Z794 Long term (current) use of insulin: Secondary | ICD-10-CM | POA: Diagnosis not present

## 2022-06-17 DIAGNOSIS — E1165 Type 2 diabetes mellitus with hyperglycemia: Secondary | ICD-10-CM | POA: Diagnosis not present

## 2022-07-07 DIAGNOSIS — N1832 Chronic kidney disease, stage 3b: Secondary | ICD-10-CM | POA: Diagnosis not present

## 2022-07-07 DIAGNOSIS — I2510 Atherosclerotic heart disease of native coronary artery without angina pectoris: Secondary | ICD-10-CM | POA: Diagnosis not present

## 2022-07-07 DIAGNOSIS — Z794 Long term (current) use of insulin: Secondary | ICD-10-CM | POA: Diagnosis not present

## 2022-07-07 DIAGNOSIS — E559 Vitamin D deficiency, unspecified: Secondary | ICD-10-CM | POA: Diagnosis not present

## 2022-07-07 DIAGNOSIS — E669 Obesity, unspecified: Secondary | ICD-10-CM | POA: Diagnosis not present

## 2022-07-07 DIAGNOSIS — Z85850 Personal history of malignant neoplasm of thyroid: Secondary | ICD-10-CM | POA: Diagnosis not present

## 2022-07-07 DIAGNOSIS — E785 Hyperlipidemia, unspecified: Secondary | ICD-10-CM | POA: Diagnosis not present

## 2022-07-07 DIAGNOSIS — I10 Essential (primary) hypertension: Secondary | ICD-10-CM | POA: Diagnosis not present

## 2022-07-07 DIAGNOSIS — E1165 Type 2 diabetes mellitus with hyperglycemia: Secondary | ICD-10-CM | POA: Diagnosis not present

## 2022-07-07 DIAGNOSIS — I700 Atherosclerosis of aorta: Secondary | ICD-10-CM | POA: Diagnosis not present

## 2022-07-07 DIAGNOSIS — I672 Cerebral atherosclerosis: Secondary | ICD-10-CM | POA: Diagnosis not present

## 2022-07-07 DIAGNOSIS — E1122 Type 2 diabetes mellitus with diabetic chronic kidney disease: Secondary | ICD-10-CM | POA: Diagnosis not present

## 2022-07-13 DIAGNOSIS — E1165 Type 2 diabetes mellitus with hyperglycemia: Secondary | ICD-10-CM | POA: Diagnosis not present

## 2022-08-08 DIAGNOSIS — N1832 Chronic kidney disease, stage 3b: Secondary | ICD-10-CM | POA: Diagnosis not present

## 2022-08-12 DIAGNOSIS — E1165 Type 2 diabetes mellitus with hyperglycemia: Secondary | ICD-10-CM | POA: Diagnosis not present

## 2022-08-15 DIAGNOSIS — Z8673 Personal history of transient ischemic attack (TIA), and cerebral infarction without residual deficits: Secondary | ICD-10-CM | POA: Diagnosis not present

## 2022-08-15 DIAGNOSIS — N1832 Chronic kidney disease, stage 3b: Secondary | ICD-10-CM | POA: Diagnosis not present

## 2022-08-15 DIAGNOSIS — E1122 Type 2 diabetes mellitus with diabetic chronic kidney disease: Secondary | ICD-10-CM | POA: Diagnosis not present

## 2022-08-15 DIAGNOSIS — R809 Proteinuria, unspecified: Secondary | ICD-10-CM | POA: Diagnosis not present

## 2022-08-15 DIAGNOSIS — E1129 Type 2 diabetes mellitus with other diabetic kidney complication: Secondary | ICD-10-CM | POA: Diagnosis not present

## 2022-08-15 DIAGNOSIS — I129 Hypertensive chronic kidney disease with stage 1 through stage 4 chronic kidney disease, or unspecified chronic kidney disease: Secondary | ICD-10-CM | POA: Diagnosis not present

## 2022-10-06 DIAGNOSIS — Z1331 Encounter for screening for depression: Secondary | ICD-10-CM | POA: Diagnosis not present

## 2022-10-06 DIAGNOSIS — E1165 Type 2 diabetes mellitus with hyperglycemia: Secondary | ICD-10-CM | POA: Diagnosis not present

## 2022-10-06 DIAGNOSIS — Z9181 History of falling: Secondary | ICD-10-CM | POA: Diagnosis not present

## 2022-10-06 DIAGNOSIS — N1832 Chronic kidney disease, stage 3b: Secondary | ICD-10-CM | POA: Diagnosis not present

## 2022-10-06 DIAGNOSIS — E890 Postprocedural hypothyroidism: Secondary | ICD-10-CM | POA: Diagnosis not present

## 2022-10-06 DIAGNOSIS — Z8673 Personal history of transient ischemic attack (TIA), and cerebral infarction without residual deficits: Secondary | ICD-10-CM | POA: Diagnosis not present

## 2022-10-06 DIAGNOSIS — I2510 Atherosclerotic heart disease of native coronary artery without angina pectoris: Secondary | ICD-10-CM | POA: Diagnosis not present

## 2022-10-06 DIAGNOSIS — I672 Cerebral atherosclerosis: Secondary | ICD-10-CM | POA: Diagnosis not present

## 2022-10-06 DIAGNOSIS — E785 Hyperlipidemia, unspecified: Secondary | ICD-10-CM | POA: Diagnosis not present

## 2022-10-06 DIAGNOSIS — E559 Vitamin D deficiency, unspecified: Secondary | ICD-10-CM | POA: Diagnosis not present

## 2022-10-06 DIAGNOSIS — Z0000 Encounter for general adult medical examination without abnormal findings: Secondary | ICD-10-CM | POA: Diagnosis not present

## 2022-10-06 DIAGNOSIS — I10 Essential (primary) hypertension: Secondary | ICD-10-CM | POA: Diagnosis not present

## 2022-10-06 DIAGNOSIS — E669 Obesity, unspecified: Secondary | ICD-10-CM | POA: Diagnosis not present

## 2022-10-06 DIAGNOSIS — Z79899 Other long term (current) drug therapy: Secondary | ICD-10-CM | POA: Diagnosis not present

## 2022-10-06 DIAGNOSIS — I700 Atherosclerosis of aorta: Secondary | ICD-10-CM | POA: Diagnosis not present

## 2022-10-23 DIAGNOSIS — Z85850 Personal history of malignant neoplasm of thyroid: Secondary | ICD-10-CM | POA: Diagnosis not present

## 2022-10-23 DIAGNOSIS — Z794 Long term (current) use of insulin: Secondary | ICD-10-CM | POA: Diagnosis not present

## 2022-10-23 DIAGNOSIS — C73 Malignant neoplasm of thyroid gland: Secondary | ICD-10-CM | POA: Diagnosis not present

## 2022-10-23 DIAGNOSIS — Z79899 Other long term (current) drug therapy: Secondary | ICD-10-CM | POA: Diagnosis not present

## 2022-10-23 DIAGNOSIS — E1165 Type 2 diabetes mellitus with hyperglycemia: Secondary | ICD-10-CM | POA: Diagnosis not present

## 2022-10-23 DIAGNOSIS — E890 Postprocedural hypothyroidism: Secondary | ICD-10-CM | POA: Diagnosis not present

## 2022-10-23 DIAGNOSIS — N1832 Chronic kidney disease, stage 3b: Secondary | ICD-10-CM | POA: Diagnosis not present

## 2022-10-23 DIAGNOSIS — E785 Hyperlipidemia, unspecified: Secondary | ICD-10-CM | POA: Diagnosis not present

## 2022-11-04 DIAGNOSIS — E1165 Type 2 diabetes mellitus with hyperglycemia: Secondary | ICD-10-CM | POA: Diagnosis not present

## 2022-11-04 DIAGNOSIS — E119 Type 2 diabetes mellitus without complications: Secondary | ICD-10-CM | POA: Diagnosis not present

## 2022-11-04 DIAGNOSIS — Z794 Long term (current) use of insulin: Secondary | ICD-10-CM | POA: Diagnosis not present

## 2022-12-11 DIAGNOSIS — E890 Postprocedural hypothyroidism: Secondary | ICD-10-CM | POA: Diagnosis not present

## 2022-12-11 DIAGNOSIS — N1832 Chronic kidney disease, stage 3b: Secondary | ICD-10-CM | POA: Diagnosis not present

## 2022-12-20 DIAGNOSIS — R809 Proteinuria, unspecified: Secondary | ICD-10-CM | POA: Diagnosis not present

## 2022-12-20 DIAGNOSIS — E1129 Type 2 diabetes mellitus with other diabetic kidney complication: Secondary | ICD-10-CM | POA: Diagnosis not present

## 2022-12-20 DIAGNOSIS — I129 Hypertensive chronic kidney disease with stage 1 through stage 4 chronic kidney disease, or unspecified chronic kidney disease: Secondary | ICD-10-CM | POA: Diagnosis not present

## 2022-12-20 DIAGNOSIS — N1832 Chronic kidney disease, stage 3b: Secondary | ICD-10-CM | POA: Diagnosis not present

## 2022-12-20 DIAGNOSIS — E1122 Type 2 diabetes mellitus with diabetic chronic kidney disease: Secondary | ICD-10-CM | POA: Diagnosis not present

## 2022-12-20 DIAGNOSIS — Z8673 Personal history of transient ischemic attack (TIA), and cerebral infarction without residual deficits: Secondary | ICD-10-CM | POA: Diagnosis not present

## 2023-01-29 ENCOUNTER — Other Ambulatory Visit: Payer: Self-pay | Admitting: Internal Medicine

## 2023-01-29 DIAGNOSIS — Z1231 Encounter for screening mammogram for malignant neoplasm of breast: Secondary | ICD-10-CM

## 2023-02-28 DIAGNOSIS — E119 Type 2 diabetes mellitus without complications: Secondary | ICD-10-CM | POA: Diagnosis not present

## 2023-02-28 DIAGNOSIS — E1165 Type 2 diabetes mellitus with hyperglycemia: Secondary | ICD-10-CM | POA: Diagnosis not present

## 2023-02-28 DIAGNOSIS — Z794 Long term (current) use of insulin: Secondary | ICD-10-CM | POA: Diagnosis not present

## 2023-03-07 ENCOUNTER — Ambulatory Visit
Admission: RE | Admit: 2023-03-07 | Discharge: 2023-03-07 | Disposition: A | Discharge status: Home / Self Care | Payer: Medicare HMO | Source: Ambulatory Visit | Attending: Internal Medicine | Admitting: Internal Medicine

## 2023-03-07 DIAGNOSIS — Z1231 Encounter for screening mammogram for malignant neoplasm of breast: Secondary | ICD-10-CM | POA: Diagnosis not present

## 2023-04-11 DIAGNOSIS — Z8673 Personal history of transient ischemic attack (TIA), and cerebral infarction without residual deficits: Secondary | ICD-10-CM | POA: Diagnosis not present

## 2023-04-11 DIAGNOSIS — E1122 Type 2 diabetes mellitus with diabetic chronic kidney disease: Secondary | ICD-10-CM | POA: Diagnosis not present

## 2023-04-11 DIAGNOSIS — E785 Hyperlipidemia, unspecified: Secondary | ICD-10-CM | POA: Diagnosis not present

## 2023-04-11 DIAGNOSIS — I10 Essential (primary) hypertension: Secondary | ICD-10-CM | POA: Diagnosis not present

## 2023-04-11 DIAGNOSIS — N1832 Chronic kidney disease, stage 3b: Secondary | ICD-10-CM | POA: Diagnosis not present

## 2023-04-11 DIAGNOSIS — Z85850 Personal history of malignant neoplasm of thyroid: Secondary | ICD-10-CM | POA: Diagnosis not present

## 2023-04-11 DIAGNOSIS — E1165 Type 2 diabetes mellitus with hyperglycemia: Secondary | ICD-10-CM | POA: Diagnosis not present

## 2023-04-11 DIAGNOSIS — E890 Postprocedural hypothyroidism: Secondary | ICD-10-CM | POA: Diagnosis not present

## 2023-04-11 DIAGNOSIS — E559 Vitamin D deficiency, unspecified: Secondary | ICD-10-CM | POA: Diagnosis not present

## 2023-04-11 DIAGNOSIS — I2510 Atherosclerotic heart disease of native coronary artery without angina pectoris: Secondary | ICD-10-CM | POA: Diagnosis not present

## 2023-04-17 DIAGNOSIS — N1832 Chronic kidney disease, stage 3b: Secondary | ICD-10-CM | POA: Diagnosis not present

## 2023-04-23 DIAGNOSIS — I129 Hypertensive chronic kidney disease with stage 1 through stage 4 chronic kidney disease, or unspecified chronic kidney disease: Secondary | ICD-10-CM | POA: Diagnosis not present

## 2023-04-23 DIAGNOSIS — Z8673 Personal history of transient ischemic attack (TIA), and cerebral infarction without residual deficits: Secondary | ICD-10-CM | POA: Diagnosis not present

## 2023-04-23 DIAGNOSIS — E1122 Type 2 diabetes mellitus with diabetic chronic kidney disease: Secondary | ICD-10-CM | POA: Diagnosis not present

## 2023-04-23 DIAGNOSIS — R809 Proteinuria, unspecified: Secondary | ICD-10-CM | POA: Diagnosis not present

## 2023-04-23 DIAGNOSIS — N1832 Chronic kidney disease, stage 3b: Secondary | ICD-10-CM | POA: Diagnosis not present

## 2023-04-23 DIAGNOSIS — E1129 Type 2 diabetes mellitus with other diabetic kidney complication: Secondary | ICD-10-CM | POA: Diagnosis not present

## 2023-04-23 DIAGNOSIS — N2581 Secondary hyperparathyroidism of renal origin: Secondary | ICD-10-CM | POA: Diagnosis not present

## 2023-05-08 DIAGNOSIS — E119 Type 2 diabetes mellitus without complications: Secondary | ICD-10-CM | POA: Diagnosis not present

## 2023-05-08 DIAGNOSIS — H524 Presbyopia: Secondary | ICD-10-CM | POA: Diagnosis not present

## 2023-05-08 DIAGNOSIS — H52223 Regular astigmatism, bilateral: Secondary | ICD-10-CM | POA: Diagnosis not present

## 2023-05-11 DIAGNOSIS — Z0100 Encounter for examination of eyes and vision without abnormal findings: Secondary | ICD-10-CM | POA: Diagnosis not present

## 2023-06-22 DIAGNOSIS — E890 Postprocedural hypothyroidism: Secondary | ICD-10-CM | POA: Diagnosis not present

## 2023-07-04 ENCOUNTER — Telehealth: Payer: Self-pay | Admitting: Pharmacist

## 2023-07-04 NOTE — Progress Notes (Addendum)
   07/04/2023  Patient ID: [name] [name], female   DOB: Aug 13, 1950, 73 y.o.   MRN: [id number]  Patient was identified for DM counseling based on A1c greater than 8% on last lab results.   Patient agreed to schedule for a free DM initial visit with me as the pharmacist. All changes are made in collaboration with the PCP. A phone visit was scheduled for 07/11/23 at [facility]. Patient's PCP located at [facility] and [facility] (out of [facility] [place]). Patient advised to attend with information regarding diabetes medications and any potential barriers surrounding current treatment. If needing to re-schedule or questions prior to the visit, patient advised to contact me at [phone].     Ricka Burdock, PharmD [facility]  Phone Number: [phone]

## 2023-07-11 ENCOUNTER — Other Ambulatory Visit: Payer: Self-pay | Admitting: Pharmacist

## 2023-07-11 NOTE — Progress Notes (Signed)
 07/11/2023 Name: [name] [name] MRN: [id number] DOB: January 20, 1951  Chief Complaint  Patient presents with   Diabetes    [name] [name] is a 74 y.o. year old female who presented for a telephone visit.   They were referred to the pharmacist by a quality report for assistance in managing diabetes.   [employer] diabetes patient.  Subjective:  Care Team: Primary Care Provider: Emilio Aspen, MD ; Next Scheduled Visit: 10/12/23 Clinical Pharmacist: Ricka Burdock, PharmD [facility]ologist: Dr. Sharl Ma; next scheduled visit: 10/12/23  Medication Access/Adherence  Current Pharmacy:  [id number] - [place], [place] - [address] [place] [address] Phone: [phone] Fax: [phone]   Patient reports affordability concerns with their medications: No  Patient reports access/transportation concerns to their pharmacy: No - still drives Patient reports adherence concerns with their medications:  No     Diabetes:  Current medications: Farxiga 10mg  daily, Basaglar 11U in the morning (max of 50U daily on script) Medications tried in the past: Tradjenta (5mg  in 2014-2018; try Januvia), Januvia (25mg  in 2018-2019), Trulicity (1.5g weekly in 2018-2020; stomach cramps/sick) , Victoza (1.8mg  daily in 2015-2017)  *Using Libre 3 to check blood sugars- uses reader $30 each month  Date of Download: 07/11/23  Average Glucose: 160 mg/dL 696, 295, 284, 132  Time in Goal:  - Time in range 70-180: 65% - Time above range: 34% - Time below range: 1% Observed patterns:   12PM to 6PM: 1 12AM to 6AM: 2; 12PM- 6PM: 1  Patient denies hypoglycemic s/sx including dizziness, shakiness, sweating. Patient denies hyperglycemic symptoms including polyuria, polydipsia, polyphagia, nocturia, neuropathy, blurred vision.  Current meal patterns:  - Breakfast: Sandwich on way to work (removes bread) - Lunch (12 w/ kids):  School lunch prepared -  Supper (4-5PM): Chicken, green beans, cauliflower rice, [employer] salad - Snacks: Occasional breakfast bar (between breakfast and lunch), small bag chips - Drinks: 1 cup coffee in AM ([employer], [employer], Starbucks several days a week), water + sugarfree peach powder  Current physical activity: Walking at work or goes to park occasionally  Current medication access support: [employer] D-SNP  *Volunteer with the schools every day  Call after 1PM   A1c check on 05/02/23: 8%  eGFR 38  Objective:  Lab Results  Component Value Date   HGBA1C (H) 07/04/2010    7.0 (NOTE)                                                                       According to the ADA Clinical Practice Recommendations for 2011, when HbA1c is used as a screening test:   >=6.5%   Diagnostic of Diabetes Mellitus           (if abnormal result  is confirmed)  5.7-6.4%   Increased risk of developing Diabetes Mellitus  References:Diagnosis and Classification of Diabetes Mellitus,Diabetes Care,2011,34(Suppl 1):S62-S69 and Standards of Medical Care in         Diabetes - 2011,Diabetes Care,2011,34  (Suppl 1):S11-S61.    Lab Results  Component Value Date   CREATININE 0.93 04/19/2013   BUN 22 04/19/2013   NA 133 (L) 04/19/2013   K 3.6 (L) 04/19/2013   CL 94 (L)  04/19/2013   CO2 24 04/19/2013    Lab Results  Component Value Date   CHOL (H) 10/25/2007    207        ATP III CLASSIFICATION:  <200     mg/dL   Desirable  782-956  mg/dL   Borderline High  >=213    mg/dL   High   HDL 29 (L) 08/65/7846   LDLCALC (H) 10/25/2007    150        Total Cholesterol/HDL:CHD Risk Coronary Heart Disease Risk Table                     Men   Women  1/2 Average Risk   3.4   3.3   TRIG 139 10/25/2007   CHOLHDL 7.1 10/25/2007    Medications Reviewed Today   Medications were not reviewed in this encounter       Assessment/Plan:   Diabetes: - Currently uncontrolled - Reviewed long term cardiovascular and renal  outcomes of uncontrolled blood sugar - Reviewed goal A1c, goal fasting, and goal 2 hour post prandial glucose - Recommend to decrease frequency of mocha frappe in the morning and changing dinner choices - Believe patient's blood sugar is going too high and counteracting with too much insulin to cause nighttime lows  - Recommend to check glucose continuously with Libre 3 plus sensor + reader - Had a chicken salad from [employer] and half a chocolate birthday milkshake last night, but still reports a reading of 67 at 4AM on reader - Was 115 at [facility] today    **Summary for PCP:** - Set task to review next A1c check and call if needed - Forgot every medicine on Sunday accidentally after Levothyroxine and headed to church - Advised to focus on dietary changes for now and eating a snack before bedtime to avoid lows if needed - Appears overnight readings are using 190-160 though, so lows have to be infrequent; would need more specific daytime data to have a better understanding for recommendations - Due to eGFR and history of "issues" with medications previously, only option would be mealtime insulin or Ozempic, but is cautious due to history of papillary thyroid carcinoma in 2015 and history of feeling sick on Trulicity  *Of note, call after 1PM if needed  Delvin File, PharmD [facility]  Phone Number: [phone]

## 2023-09-21 DIAGNOSIS — Z008 Encounter for other general examination: Secondary | ICD-10-CM | POA: Diagnosis not present

## 2023-09-21 DIAGNOSIS — E1151 Type 2 diabetes mellitus with diabetic peripheral angiopathy without gangrene: Secondary | ICD-10-CM | POA: Diagnosis not present

## 2023-09-21 DIAGNOSIS — E785 Hyperlipidemia, unspecified: Secondary | ICD-10-CM | POA: Diagnosis not present

## 2023-09-21 DIAGNOSIS — Z794 Long term (current) use of insulin: Secondary | ICD-10-CM | POA: Diagnosis not present

## 2023-09-21 DIAGNOSIS — I69354 Hemiplegia and hemiparesis following cerebral infarction affecting left non-dominant side: Secondary | ICD-10-CM | POA: Diagnosis not present

## 2023-09-21 DIAGNOSIS — E1140 Type 2 diabetes mellitus with diabetic neuropathy, unspecified: Secondary | ICD-10-CM | POA: Diagnosis not present

## 2023-09-21 DIAGNOSIS — I130 Hypertensive heart and chronic kidney disease with heart failure and stage 1 through stage 4 chronic kidney disease, or unspecified chronic kidney disease: Secondary | ICD-10-CM | POA: Diagnosis not present

## 2023-09-21 DIAGNOSIS — E1122 Type 2 diabetes mellitus with diabetic chronic kidney disease: Secondary | ICD-10-CM | POA: Diagnosis not present

## 2023-09-21 DIAGNOSIS — I4891 Unspecified atrial fibrillation: Secondary | ICD-10-CM | POA: Diagnosis not present

## 2023-09-21 DIAGNOSIS — I700 Atherosclerosis of aorta: Secondary | ICD-10-CM | POA: Diagnosis not present

## 2023-09-21 DIAGNOSIS — I509 Heart failure, unspecified: Secondary | ICD-10-CM | POA: Diagnosis not present

## 2023-09-21 DIAGNOSIS — N1832 Chronic kidney disease, stage 3b: Secondary | ICD-10-CM | POA: Diagnosis not present

## 2023-10-12 DIAGNOSIS — E785 Hyperlipidemia, unspecified: Secondary | ICD-10-CM | POA: Diagnosis not present

## 2023-10-12 DIAGNOSIS — E1165 Type 2 diabetes mellitus with hyperglycemia: Secondary | ICD-10-CM | POA: Diagnosis not present

## 2023-10-12 DIAGNOSIS — N1832 Chronic kidney disease, stage 3b: Secondary | ICD-10-CM | POA: Diagnosis not present

## 2023-10-12 DIAGNOSIS — Z794 Long term (current) use of insulin: Secondary | ICD-10-CM | POA: Diagnosis not present

## 2023-10-12 DIAGNOSIS — E559 Vitamin D deficiency, unspecified: Secondary | ICD-10-CM | POA: Diagnosis not present

## 2023-10-12 DIAGNOSIS — Z85850 Personal history of malignant neoplasm of thyroid: Secondary | ICD-10-CM | POA: Diagnosis not present

## 2023-10-12 DIAGNOSIS — Z1331 Encounter for screening for depression: Secondary | ICD-10-CM | POA: Diagnosis not present

## 2023-10-12 DIAGNOSIS — E890 Postprocedural hypothyroidism: Secondary | ICD-10-CM | POA: Diagnosis not present

## 2023-10-12 DIAGNOSIS — Z0000 Encounter for general adult medical examination without abnormal findings: Secondary | ICD-10-CM | POA: Diagnosis not present

## 2023-10-17 DIAGNOSIS — N1832 Chronic kidney disease, stage 3b: Secondary | ICD-10-CM | POA: Diagnosis not present

## 2023-10-22 ENCOUNTER — Ambulatory Visit: Admitting: Podiatry

## 2023-10-22 ENCOUNTER — Encounter: Payer: Self-pay | Admitting: Podiatry

## 2023-10-22 DIAGNOSIS — Z860100 Personal history of colon polyps, unspecified: Secondary | ICD-10-CM | POA: Insufficient documentation

## 2023-10-22 DIAGNOSIS — E119 Type 2 diabetes mellitus without complications: Secondary | ICD-10-CM

## 2023-10-22 DIAGNOSIS — M79675 Pain in left toe(s): Secondary | ICD-10-CM | POA: Diagnosis not present

## 2023-10-22 DIAGNOSIS — I739 Peripheral vascular disease, unspecified: Secondary | ICD-10-CM | POA: Insufficient documentation

## 2023-10-22 DIAGNOSIS — B351 Tinea unguium: Secondary | ICD-10-CM

## 2023-10-22 DIAGNOSIS — M79674 Pain in right toe(s): Secondary | ICD-10-CM

## 2023-10-22 DIAGNOSIS — E1165 Type 2 diabetes mellitus with hyperglycemia: Secondary | ICD-10-CM | POA: Insufficient documentation

## 2023-10-22 DIAGNOSIS — Z794 Long term (current) use of insulin: Secondary | ICD-10-CM | POA: Insufficient documentation

## 2023-10-22 DIAGNOSIS — N1832 Chronic kidney disease, stage 3b: Secondary | ICD-10-CM | POA: Insufficient documentation

## 2023-10-22 DIAGNOSIS — I2510 Atherosclerotic heart disease of native coronary artery without angina pectoris: Secondary | ICD-10-CM | POA: Insufficient documentation

## 2023-10-22 DIAGNOSIS — J302 Other seasonal allergic rhinitis: Secondary | ICD-10-CM | POA: Insufficient documentation

## 2023-10-22 DIAGNOSIS — C73 Malignant neoplasm of thyroid gland: Secondary | ICD-10-CM | POA: Insufficient documentation

## 2023-10-22 DIAGNOSIS — E1122 Type 2 diabetes mellitus with diabetic chronic kidney disease: Secondary | ICD-10-CM | POA: Insufficient documentation

## 2023-10-22 DIAGNOSIS — M85852 Other specified disorders of bone density and structure, left thigh: Secondary | ICD-10-CM | POA: Insufficient documentation

## 2023-10-22 NOTE — Progress Notes (Signed)
   Chief Complaint  Patient presents with   Debridement    Requesting toenail trim - had a stoke and hasn't been able to get down to her feet well since and unable to grip the clippers, diabetic - last A1c was 7.0   New Patient (Initial Visit)    SUBJECTIVE Patient with a history of diabetes mellitus presents to office today complaining of elongated, thickened nails that cause pain while ambulating in shoes.  Patient is unable to trim their own nails. Patient is here for further evaluation and treatment.  Past Medical History:  Diagnosis Date   Arthritis    Cataract    Diabetes mellitus    Hyperlipidemia    Hypertension    Dr. Andria cardiologist   Stroke [facility]) 2009   left arm and left leg neuropathy   Thyroid  cancer (HCC)    Thyroid  disease     Allergies  Allergen Reactions   Demerol [Meperidine] Hives   Dulaglutide     Other Reaction(s): stomach upset     OBJECTIVE General Patient is awake, alert, and oriented x 3 and in no acute distress. Derm Skin is dry and supple bilateral. Negative open lesions or macerations. Remaining integument unremarkable. Nails are tender, long, thickened and dystrophic with subungual debris, consistent with onychomycosis, 1-5 bilateral. No signs of infection noted. Vasc  DP and PT pedal pulses palpable bilaterally. Temperature gradient within normal limits.  Neuro light touch and protective threshold sensation grossly intact musculoskeletal Exam No symptomatic pedal deformities noted bilateral. Muscular strength within normal limits.  ASSESSMENT 1. Diabetes Mellitus w/ peripheral neuropathy 2.  Pain due to onychomycosis of toenails bilateral 3.  Encounter for diabetic foot exam  PLAN OF CARE 1. Patient evaluated today.  Comprehensive diabetic foot exam performed today 2. Instructed to maintain good pedal hygiene and foot care. Stressed importance of controlling blood sugar.  3. Mechanical debridement of nails 1-5 bilaterally performed  using a nail nipper. Filed with dremel without incident.  4. Return to clinic in 3 mos.     Thresa EMERSON Sar, DPM [facility]  Dr. Thresa EMERSON Sar, DPM    [address] [place], [place] [address]                Office [phone]  Fax [phone]

## 2023-10-24 DIAGNOSIS — N1832 Chronic kidney disease, stage 3b: Secondary | ICD-10-CM | POA: Diagnosis not present

## 2023-10-24 DIAGNOSIS — Z8673 Personal history of transient ischemic attack (TIA), and cerebral infarction without residual deficits: Secondary | ICD-10-CM | POA: Diagnosis not present

## 2023-10-24 DIAGNOSIS — I129 Hypertensive chronic kidney disease with stage 1 through stage 4 chronic kidney disease, or unspecified chronic kidney disease: Secondary | ICD-10-CM | POA: Diagnosis not present

## 2023-10-24 DIAGNOSIS — N2581 Secondary hyperparathyroidism of renal origin: Secondary | ICD-10-CM | POA: Diagnosis not present

## 2023-10-24 DIAGNOSIS — E1122 Type 2 diabetes mellitus with diabetic chronic kidney disease: Secondary | ICD-10-CM | POA: Diagnosis not present

## 2023-10-24 DIAGNOSIS — R809 Proteinuria, unspecified: Secondary | ICD-10-CM | POA: Diagnosis not present

## 2024-01-04 DIAGNOSIS — Z111 Encounter for screening for respiratory tuberculosis: Secondary | ICD-10-CM | POA: Diagnosis not present

## 2024-01-07 DIAGNOSIS — Z111 Encounter for screening for respiratory tuberculosis: Secondary | ICD-10-CM | POA: Diagnosis not present

## 2024-01-22 ENCOUNTER — Ambulatory Visit: Admitting: Podiatry

## 2024-01-22 DIAGNOSIS — M79674 Pain in right toe(s): Secondary | ICD-10-CM | POA: Diagnosis not present

## 2024-01-22 DIAGNOSIS — M79675 Pain in left toe(s): Secondary | ICD-10-CM | POA: Diagnosis not present

## 2024-01-22 DIAGNOSIS — B351 Tinea unguium: Secondary | ICD-10-CM

## 2024-01-22 DIAGNOSIS — Z794 Long term (current) use of insulin: Secondary | ICD-10-CM

## 2024-01-22 DIAGNOSIS — N1832 Chronic kidney disease, stage 3b: Secondary | ICD-10-CM

## 2024-01-22 DIAGNOSIS — E118 Type 2 diabetes mellitus with unspecified complications: Secondary | ICD-10-CM

## 2024-01-22 NOTE — Progress Notes (Addendum)
 This patient returns to my office for at risk foot care.  This patient requires this care by a professional since this patient will be at risk due to having diabetes and chronic kidney disease.  This patient is unable to cut nails herself since the patient cannot reach her nails.These nails are painful walking and wearing shoes.  This patient presents for at risk foot care today.  General Appearance  Alert, conversant and in no acute stress.  Vascular  Dorsalis pedis and posterior tibial  pulses are palpable  bilaterally.  Capillary return is within normal limits  bilaterally. Temperature is within normal limits  bilaterally.  Neurologic  Senn-Weinstein monofilament wire test within normal limits  bilaterally. Muscle power within normal limits bilaterally.  Nails Thick disfigured discolored nails with subungual debris  from hallux to fifth toes bilaterally. No evidence of bacterial infection or drainage bilaterally.  Orthopedic  No limitations of motion  feet .  No crepitus or effusions noted.  No bony pathology or digital deformities noted.  Skin  normotropic skin with no porokeratosis noted bilaterally.  No signs of infections or ulcers noted.     Onychomycosis  Pain in right toes  Pain in left toes  Consent was obtained for treatment procedures.   Mechanical debridement of nails 1-5  bilaterally performed with a nail nipper.  Filed with dremel without incident.    Return office visit     3 months  /sm               Told patient to return for periodic foot care and evaluation due to potential at risk complications.   Cordella Bold DPM

## 2024-02-04 ENCOUNTER — Other Ambulatory Visit: Payer: Self-pay | Admitting: Internal Medicine

## 2024-02-04 DIAGNOSIS — Z1231 Encounter for screening mammogram for malignant neoplasm of breast: Secondary | ICD-10-CM

## 2024-02-13 DIAGNOSIS — N1832 Chronic kidney disease, stage 3b: Secondary | ICD-10-CM | POA: Diagnosis not present

## 2024-02-20 DIAGNOSIS — I129 Hypertensive chronic kidney disease with stage 1 through stage 4 chronic kidney disease, or unspecified chronic kidney disease: Secondary | ICD-10-CM | POA: Diagnosis not present

## 2024-02-20 DIAGNOSIS — E1122 Type 2 diabetes mellitus with diabetic chronic kidney disease: Secondary | ICD-10-CM | POA: Diagnosis not present

## 2024-02-20 DIAGNOSIS — N1832 Chronic kidney disease, stage 3b: Secondary | ICD-10-CM | POA: Diagnosis not present

## 2024-02-20 DIAGNOSIS — R809 Proteinuria, unspecified: Secondary | ICD-10-CM | POA: Diagnosis not present

## 2024-02-20 DIAGNOSIS — N2581 Secondary hyperparathyroidism of renal origin: Secondary | ICD-10-CM | POA: Diagnosis not present

## 2024-03-07 ENCOUNTER — Ambulatory Visit
Admission: RE | Admit: 2024-03-07 | Discharge: 2024-03-07 | Disposition: A | Source: Ambulatory Visit | Attending: Internal Medicine | Admitting: Internal Medicine

## 2024-03-07 DIAGNOSIS — Z1231 Encounter for screening mammogram for malignant neoplasm of breast: Secondary | ICD-10-CM

## 2024-04-22 ENCOUNTER — Ambulatory Visit: Admitting: Podiatry

## 2024-04-24 ENCOUNTER — Ambulatory Visit: Admitting: Podiatry

## 2024-04-24 DIAGNOSIS — M79675 Pain in left toe(s): Secondary | ICD-10-CM | POA: Diagnosis not present

## 2024-04-24 DIAGNOSIS — E118 Type 2 diabetes mellitus with unspecified complications: Secondary | ICD-10-CM | POA: Diagnosis not present

## 2024-04-24 DIAGNOSIS — Z794 Long term (current) use of insulin: Secondary | ICD-10-CM

## 2024-04-24 DIAGNOSIS — B351 Tinea unguium: Secondary | ICD-10-CM

## 2024-04-24 DIAGNOSIS — N1832 Chronic kidney disease, stage 3b: Secondary | ICD-10-CM | POA: Diagnosis not present

## 2024-04-24 DIAGNOSIS — M79674 Pain in right toe(s): Secondary | ICD-10-CM | POA: Diagnosis not present

## 2024-04-24 NOTE — Progress Notes (Signed)
This patient returns to my office for at risk foot care.  This patient requires this care by a professional since this patient will be at risk due to having diabetes.This patient is unable to cut nails herself since the patient cannot reach her nails.These nails are painful walking and wearing shoes.  This patient presents for at risk foot care today.  General Appearance  Alert, conversant and in no acute stress.  Vascular  Dorsalis pedis and posterior tibial  pulses are  weakly palpable  bilaterally.  Capillary return is within normal limits  bilaterally. Temperature is within normal limits  bilaterally.  Neurologic  Senn-Weinstein monofilament wire test within normal limits  bilaterally. Muscle power within normal limits bilaterally.  Nails Thick disfigured discolored nails with subungual debris  from hallux to fifth toes bilaterally. No evidence of bacterial infection or drainage bilaterally.  Orthopedic  No limitations of motion  feet .  No crepitus or effusions noted.  No bony pathology or digital deformities noted.  Skin  normotropic skin with no porokeratosis noted bilaterally.  No signs of infections or ulcers noted.     Onychomycosis  Pain in right toes  Pain in left toes  Consent was obtained for treatment procedures.   Mechanical debridement of nails 1-5  bilaterally performed with a nail nipper.  Filed with dremel without incident.    Return office visit   3 months                   Told patient to return for periodic foot care and evaluation due to potential at risk complications.   Gracelin Weisberg DPM  

## 2024-07-23 ENCOUNTER — Ambulatory Visit: Admitting: Podiatry
